# Patient Record
Sex: Female | Born: 1975 | Race: Black or African American | Hispanic: No | Marital: Single | Smoking: Never smoker
Health system: Southern US, Community
[De-identification: ages and names within clinical notes are randomized; demographics above are authoritative.]

## PROBLEM LIST (undated history)

## (undated) DIAGNOSIS — I10 Essential (primary) hypertension: Secondary | ICD-10-CM

## (undated) HISTORY — PX: BACK SURGERY: SHX140

---

## 2004-02-25 ENCOUNTER — Emergency Department: Payer: Self-pay | Admitting: Emergency Medicine

## 2005-02-19 ENCOUNTER — Observation Stay: Payer: Self-pay | Admitting: Obstetrics and Gynecology

## 2005-09-21 ENCOUNTER — Emergency Department: Payer: Self-pay | Admitting: Emergency Medicine

## 2014-05-19 ENCOUNTER — Emergency Department: Payer: Self-pay | Admitting: Emergency Medicine

## 2014-05-19 LAB — SALICYLATE LEVEL

## 2014-05-19 LAB — COMPREHENSIVE METABOLIC PANEL
ALT: 17 U/L
AST: 28 U/L
Albumin: 3.7 g/dL
Alkaline Phosphatase: 79 U/L
Anion Gap: 7 (ref 7–16)
BUN: 7 mg/dL
Bilirubin,Total: 0.4 mg/dL
CHLORIDE: 102 mmol/L
CO2: 25 mmol/L
Calcium, Total: 8 mg/dL — ABNORMAL LOW
Creatinine: 0.66 mg/dL
EGFR (African American): >60
Glucose: 109 mg/dL — ABNORMAL HIGH
Potassium: 2.8 mmol/L — ABNORMAL LOW
Sodium: 134 mmol/L — ABNORMAL LOW
Total Protein: 8.6 g/dL — ABNORMAL HIGH

## 2014-05-19 LAB — CBC
HCT: 33.9 % — AB (ref 35.0–47.0)
HGB: 10.8 g/dL — ABNORMAL LOW (ref 12.0–16.0)
MCH: 24.7 pg — ABNORMAL LOW (ref 26.0–34.0)
MCHC: 31.7 g/dL — ABNORMAL LOW (ref 32.0–36.0)
MCV: 78 fL — ABNORMAL LOW (ref 80–100)
PLATELETS: 344 10*3/uL (ref 150–440)
RBC: 4.35 10*6/uL (ref 3.80–5.20)
RDW: 15.3 % — AB (ref 11.5–14.5)
WBC: 7 10*3/uL (ref 3.6–11.0)

## 2014-05-19 LAB — URINALYSIS, COMPLETE
Bacteria: NONE SEEN
Glucose,UR: NEGATIVE mg/dL (ref 0–75)
Nitrite: NEGATIVE
Ph: 5 (ref 4.5–8.0)
RBC,UR: 3582 /HPF (ref 0–5)
SPECIFIC GRAVITY: 1.033 (ref 1.003–1.030)
Squamous Epithelial: 3
WBC UR: 15 /HPF (ref 0–5)

## 2014-05-19 LAB — ETHANOL

## 2014-05-19 LAB — DRUG SCREEN, URINE
Amphetamines, Ur Screen: NEGATIVE
Barbiturates, Ur Screen: NEGATIVE
Benzodiazepine, Ur Scrn: NEGATIVE
Cannabinoid 50 Ng, Ur ~~LOC~~: NEGATIVE
Cocaine Metabolite,Ur ~~LOC~~: NEGATIVE
MDMA (ECSTASY) UR SCREEN: NEGATIVE
METHADONE, UR SCREEN: NEGATIVE
Opiate, Ur Screen: NEGATIVE
Phencyclidine (PCP) Ur S: NEGATIVE
TRICYCLIC, UR SCREEN: NEGATIVE

## 2014-05-19 LAB — ACETAMINOPHEN LEVEL: Acetaminophen: <10 ug/mL

## 2014-05-19 LAB — PREGNANCY, URINE: Pregnancy Test, Urine: NEGATIVE m[IU]/mL

## 2014-06-24 NOTE — Consult Note (Signed)
PATIENT NAME:  Vanessa Banks, Vanessa Banks MR#:  [id number] DATE OF BIRTH:  1975/07/23  DATE OF CONSULTATION:  05/20/2014  REFERRING PHYSICIAN:   CONSULTING PHYSICIAN:  Kemiyah Tarazon K. Guss Bundehalla, MD  PL[facility] DICTATION[facility], [place].  AGE: 39 years.  SEX: Female.  RACE: African American.  SUBJECTIVE: The patient was seen in consultation at [facility]. The patient is a 39 year old African American female, not employed, and last worked a long time ago, and is married to her husband who is 39 years old and works at [employer]. The patient stays home with her 2 kids who are 43248-year-old and 748-year-old. Last night, the patient got into a conflict with her husband and she told him that she had thoughts to hurt herself. The patient drove [facility] because she was feeling increasingly depressed, stressed out, and having suicidal wishes.  PAST PSYCHIATRIC HISTORY: No previous history of inpatient on psychiatry.  No history of suicidal attempt. Not being followed by any psychiatrist.  ALCOHOL AND DRUGS: Denies drinking alcohol. Denies street or prescription drug abuse.  Denies smoking nicotine cigarettes.   MENTAL STATUS EXAMINATION: The patient is alert and oriented, calm, pleasant, and cooperative. No agitation. Affect neutral. Mood stable. Admits that she was feeling low and down because of marital conflicts and financial issues. No psychosis. Does not appear to be responding to internal stimuli. Cognition intact. General fund of knowledge and information fair. Denies suicidal or homicidal plans and contracts for safety. Insight and judgment fair. Impulse control is fair.   IMPRESSION: Major depression, single episode with suicidal idea, which has resolved and contracts for safety.  RECOMMENDATIONS: Discontinue involuntary commitment. Discharge the patient home and the patient will get an appointment in a local mental health clinic tomorrow, 05/21/2014, for  evaluation for depression and marital conflicts and follow up with the same.   ____________________________ Jannet MantisSurya K. Guss Bundehalla, MD skc:ST D: 05/20/2014 18:15:18 ET T: 05/20/2014 22:37:18 ET JOB#: [id number]  cc: Monika SalkSurya K. Guss Bundehalla, MD, <Dictator> Beau FannySURYA K Janan Bogie MD ELECTRONICALLY SIGNED 05/26/2014 18:20

## 2014-12-26 ENCOUNTER — Encounter: Payer: Self-pay | Admitting: Emergency Medicine

## 2014-12-26 ENCOUNTER — Emergency Department
Admission: EM | Admit: 2014-12-26 | Discharge: 2014-12-26 | Disposition: A | Discharge status: Home / Self Care | Payer: Medicaid Other | Attending: Emergency Medicine | Admitting: Emergency Medicine

## 2014-12-26 ENCOUNTER — Emergency Department: Payer: Medicaid Other

## 2014-12-26 DIAGNOSIS — Z79899 Other long term (current) drug therapy: Secondary | ICD-10-CM | POA: Insufficient documentation

## 2014-12-26 DIAGNOSIS — R102 Pelvic and perineal pain: Secondary | ICD-10-CM

## 2014-12-26 DIAGNOSIS — R109 Unspecified abdominal pain: Secondary | ICD-10-CM

## 2014-12-26 DIAGNOSIS — N939 Abnormal uterine and vaginal bleeding, unspecified: Secondary | ICD-10-CM | POA: Diagnosis not present

## 2014-12-26 DIAGNOSIS — Z3202 Encounter for pregnancy test, result negative: Secondary | ICD-10-CM | POA: Diagnosis not present

## 2014-12-26 LAB — URINALYSIS COMPLETE WITH MICROSCOPIC (ARMC ONLY)
Bilirubin Urine: NEGATIVE
Glucose, UA: NEGATIVE mg/dL
KETONES UR: NEGATIVE mg/dL
LEUKOCYTES UA: NEGATIVE
Nitrite: NEGATIVE
PH: 7 (ref 5.0–8.0)
PROTEIN: 30 mg/dL — AB
SPECIFIC GRAVITY, URINE: 1.03 (ref 1.005–1.030)

## 2014-12-26 LAB — WET PREP, GENITAL: Trich, Wet Prep: NEGATIVE — AB

## 2014-12-26 LAB — HCG, QUANTITATIVE, PREGNANCY: hCG, Beta Chain, Quant, S: <1 m[IU]/mL (ref <5)

## 2014-12-26 LAB — CHLAMYDIA/NGC RT PCR (ARMC ONLY)
Chlamydia Tr: NOT DETECTED
N GONORRHOEAE: NOT DETECTED

## 2014-12-26 LAB — CBC
HCT: 31.1 % — ABNORMAL LOW (ref 35.0–47.0)
Hemoglobin: 9.9 g/dL — ABNORMAL LOW (ref 12.0–16.0)
MCH: 24.9 pg — AB (ref 26.0–34.0)
MCHC: 31.7 g/dL — ABNORMAL LOW (ref 32.0–36.0)
MCV: 78.5 fL — AB (ref 80.0–100.0)
PLATELETS: 352 10*3/uL (ref 150–440)
RBC: 3.97 MIL/uL (ref 3.80–5.20)
RDW: 15.4 % — AB (ref 11.5–14.5)
WBC: 5.9 10*3/uL (ref 3.6–11.0)

## 2014-12-26 MED ORDER — IBUPROFEN 800 MG PO TABS
800.0000 mg | ORAL_TABLET | Freq: Once | ORAL | Status: AC
  Administered 2014-12-26: 800 mg via ORAL
  Filled 2014-12-26: qty 1

## 2014-12-26 MED ORDER — TRAMADOL HCL 50 MG PO TABS
50.0000 mg | ORAL_TABLET | Freq: Once | ORAL | Status: AC
  Administered 2014-12-26: 50 mg via ORAL

## 2014-12-26 MED ORDER — TRAMADOL HCL 50 MG PO TABS: 50.0000 mg | ORAL_TABLET | Freq: Four times a day (QID) | ORAL | Status: AC | PRN

## 2014-12-26 MED ORDER — OXYCODONE-ACETAMINOPHEN 5-325 MG PO TABS
1.0000 | ORAL_TABLET | Freq: Once | ORAL | Status: AC
  Administered 2014-12-26: 1 via ORAL
  Filled 2014-12-26: qty 1

## 2014-12-26 MED ORDER — TRAMADOL HCL 50 MG PO TABS
ORAL_TABLET | ORAL | Status: AC
  Administered 2014-12-26: 50 mg via ORAL
  Filled 2014-12-26: qty 1

## 2014-12-26 NOTE — ED Notes (Signed)
Pt to triage via w/c, tearful; reports menstrual period started yesterday and is usually heavy; tonight began heavier bleeding with severe vaginal pain

## 2014-12-26 NOTE — ED Notes (Signed)
Patient transported to Ultrasound 

## 2014-12-26 NOTE — ED Notes (Signed)
Pt was provided discharge instructions with verbalization of understanding. Pt was given the opportunity to ask questions and questions were answered. Pt was provided pads upon discharge home for heavy bleeding. Pt was wheeled out to the car in a wheel chair per pt request.

## 2014-12-26 NOTE — ED Notes (Signed)
Pt ambulatory to toilet at this time time no concerns.

## 2014-12-26 NOTE — Discharge Instructions (Signed)
Dysfunctional Uterine Bleeding °Dysfunctional uterine bleeding is abnormal bleeding from the uterus. Dysfunctional uterine bleeding includes: °· A period that comes earlier or later than usual. °· A period that is lighter, heavier, or has blood clots. °· Bleeding between periods. °· Skipping one or more periods. °· Bleeding after sexual intercourse. °· Bleeding after menopause. °HOME CARE INSTRUCTIONS  °Pay attention to any changes in your symptoms. Follow these instructions to help with your condition: °Eating °· Eat well-balanced meals. Include foods that are high in iron, such as liver, meat, shellfish, green leafy vegetables, and eggs. °· If you become constipated: °¨ Drink plenty of water. °¨ Eat fruits and vegetables that are high in water and fiber, such as spinach, carrots, raspberries, apples, and mango. °Medicines °· Take over-the-counter and prescription medicines only as told by your health care provider. °· Do not change medicines without talking with your health care provider. °· Aspirin or medicines that contain aspirin may make the bleeding worse. Do not take those medicines: °¨ During the week before your period. °¨ During your period. °· If you were prescribed iron pills, take them as told by your health care provider. Iron pills help to replace iron that your body loses because of this condition. °Activity °· If you need to change your sanitary pad or tampon more than one time every 2 hours: °¨ Lie in bed with your feet raised (elevated). °¨ Place a cold pack on your lower abdomen. °¨ Rest as much as possible until the bleeding stops or slows down. °· Do not try to lose weight until the bleeding has stopped and your blood iron level is back to normal. °Other Instructions °· For two months, write down: °¨ When your period starts. °¨ When your period ends. °¨ When any abnormal bleeding occurs. °¨ What problems you notice. °· Keep all follow up visits as told by your health care provider. This is  important. °SEEK MEDICAL CARE IF: °· You get light-headed or weak. °· You have nausea and vomiting. °· You cannot eat or drink without vomiting. °· You feel dizzy or have diarrhea while you are taking medicines. °· You are taking birth control pills or hormones, and you want to change them or stop taking them. °SEEK IMMEDIATE MEDICAL CARE IF: °· You develop a fever or chills. °· You need to change your sanitary pad or tampon more than one time per hour. °· Your bleeding becomes heavier, or your flow contains clots more often. °· You develop pain in your abdomen. °· You lose consciousness. °· You develop a rash. °  °This information is not intended to replace advice given to you by your health care provider. Make sure you discuss any questions you have with your health care provider. °  °Document Released: 02/07/2000 Document Revised: 10/31/2014 Document Reviewed: 05/07/2014 °Elsevier Interactive Patient Education ©2016 Elsevier Inc. ° °Pelvic Pain, Female °Female pelvic pain can be caused by many different things and start from a variety of places. Pelvic pain refers to pain that is located in the lower half of the abdomen and between your hips. The pain may occur over a short period of time (acute) or may be reoccurring (chronic). The cause of pelvic pain may be related to disorders affecting the female reproductive organs (gynecologic), but it may also be related to the bladder, kidney stones, an intestinal complication, or muscle or skeletal problems. Getting help right away for pelvic pain is important, especially if there has been severe, sharp, or a sudden   onset of unusual pain. It is also important to get help right away because some types of pelvic pain can be life threatening.  CAUSES  Below are only some of the causes of pelvic pain. The causes of pelvic pain can be in one of several categories.   Gynecologic.  Pelvic inflammatory disease.  Sexually transmitted infection.  Ovarian cyst or a  twisted ovarian ligament (ovarian torsion).  Uterine lining that grows outside the uterus (endometriosis).  Fibroids, cysts, or tumors.  Ovulation.  Pregnancy.  Pregnancy that occurs outside the uterus (ectopic pregnancy).  Miscarriage.  Labor.  Abruption of the placenta or ruptured uterus.  Infection.  Uterine infection (endometritis).  Bladder infection.  Diverticulitis.  Miscarriage related to a uterine infection (septic abortion).  Bladder.  Inflammation of the bladder (cystitis).  Kidney stone(s).  Gastrointestinal.  Constipation.  Diverticulitis.  Neurologic.  Trauma.  Feeling pelvic pain because of mental or emotional causes (psychosomatic).  Cancers of the bowel or pelvis. EVALUATION  Your caregiver will want to take a careful history of your concerns. This includes recent changes in your health, a careful gynecologic history of your periods (menses), and a sexual history. Obtaining your family history and medical history is also important. Your caregiver may suggest a pelvic exam. A pelvic exam will help identify the location and severity of the pain. It also helps in the evaluation of which organ system may be involved. In order to identify the cause of the pelvic pain and be properly treated, your caregiver may order tests. These tests may include:   A pregnancy test.  Pelvic ultrasonography.  An X-ray exam of the abdomen.  A urinalysis or evaluation of vaginal discharge.  Blood tests. HOME CARE INSTRUCTIONS   Only take over-the-counter or prescription medicines for pain, discomfort, or fever as directed by your caregiver.   Rest as directed by your caregiver.   Eat a balanced diet.   Drink enough fluids to make your urine clear or pale yellow, or as directed.   Avoid sexual intercourse if it causes pain.   Apply warm or cold compresses to the lower abdomen depending on which one helps the pain.   Avoid stressful situations.    Keep a journal of your pelvic pain. Write down when it started, where the pain is located, and if there are things that seem to be associated with the pain, such as food or your menstrual cycle.  Follow up with your caregiver as directed.  SEEK MEDICAL CARE IF:  Your medicine does not help your pain.  You have abnormal vaginal discharge. SEEK IMMEDIATE MEDICAL CARE IF:   You have heavy bleeding from the vagina.   Your pelvic pain increases.   You feel light-headed or faint.   You have chills.   You have pain with urination or blood in your urine.   You have uncontrolled diarrhea or vomiting.   You have a fever or persistent symptoms for more than 3 days.  You have a fever and your symptoms suddenly get worse.   You are being physically or sexually abused.   This information is not intended to replace advice given to you by your health care provider. Make sure you discuss any questions you have with your health care provider.   Document Released: 01/07/2004 Document Revised: 10/31/2014 Document Reviewed: 06/01/2011 Elsevier Interactive Patient Education [employer]

## 2014-12-26 NOTE — ED Notes (Signed)
Pt returned from US

## 2014-12-26 NOTE — ED Provider Notes (Signed)
[facility] Emergency Department Provider Note  ____________________________________________  Time seen: Approximately 104 AM  I have reviewed the triage vital signs and the nursing notes.   HISTORY  Chief Complaint Vaginal Bleeding    HPI Luz LexGeorgette Matzek is a 39 y.o. female who comes into the hospital today with vaginal pain and vaginal bleeding. The patient reports that she has a history of heavy periods but tonight she had sharp pain from her stomach to her vagina. The patient reports that she started her period yesterday and has been taking Motrin and Pamprin but the bleeding was a little heavier than normal and this evening she started having this vaginal pain. The patient reports that the pain is sharp and 10 out of 10 in intensity. The patient denies any daily medication and reports that her periods typically last about 6 days. The patient reports that she took her typical medications but they did not help with the symptoms today. The patient reports she has not had pain like this in the past so she decided to come in for evaluation. Nothing makes the pain better or worse. The patient has not had any nausea, no back pain, no pain with urination, no shortness of breath or chest pain.   History reviewed. No pertinent past medical history.  There are no active problems to display for this patient.   Past Surgical History  Procedure Laterality Date  . Back surgery      Current Outpatient Rx  Name  Route  Sig  Dispense  Refill  . sertraline (ZOLOFT) 50 MG tablet   Oral   Take 50 mg by mouth daily.         . traMADol (ULTRAM) 50 MG tablet   Oral   Take 1 tablet (50 mg total) by mouth every 6 (six) hours as needed.   12 tablet   0     Allergies Review of patient's allergies indicates no known allergies.  No family history on file.  Social History Social History  Substance Use Topics  . Smoking status: Never Smoker   . Smokeless tobacco:  None  . Alcohol Use: No    Review of Systems Constitutional: No fever/chills Eyes: No visual changes. ENT: No sore throat. Cardiovascular: Denies chest pain. Respiratory: Denies shortness of breath. Gastrointestinal: abdominal pain.  No nausea, no vomiting.  No diarrhea.  No constipation. Genitourinary: Vaginal bleeding and vaginal pain. Musculoskeletal: Negative for back pain. Skin: Negative for rash. Neurological: Negative for headaches, focal weakness or numbness.  10-point ROS otherwise negative.  ____________________________________________   PHYSICAL EXAM:  VITAL SIGNS: ED Triage Vitals  Enc Vitals Group     BP 12/26/14 0016 182/151 mmHg     Pulse Rate 12/26/14 0016 83     Resp 12/26/14 0016 28     Temp 12/26/14 0016 98.6 F (37 C)     Temp Source 12/26/14 0016 Oral     SpO2 12/26/14 0016 99 %     Weight 12/26/14 0016 210 lb (95.255 kg)     Height 12/26/14 0016 5\' 5"  (1.651 m)     Head Cir --      Peak Flow --      Pain Score 12/26/14 0014 10     Pain Loc --      Pain Edu? --      Excl. in GC? --     Constitutional: Alert and oriented. Well appearing and in a rate distress. Eyes: Conjunctivae are normal. PERRL. EOMI.  Head: Atraumatic. Nose: No congestion/rhinnorhea. Mouth/Throat: Mucous membranes are moist.  Oropharynx non-erythematous. Cardiovascular: Normal rate, regular rhythm. Grossly normal heart sounds.  Good peripheral circulation. Respiratory: Normal respiratory effort.  No retractions. Lungs CTAB. Gastrointestinal: Soft and nontender. No distention. Positive bowel sounds Genitourinary: Normal external genitalia. Minimal blood in the vaginal vault. Some tenderness and firmness to the uterus, no foul-smelling odor, no cervical motion tenderness. Musculoskeletal: No lower extremity tenderness nor edema.   Neurologic:  Normal speech and language. No gross focal neurologic deficits are appreciated.  Skin:  Skin is warm, dry and intact.  Psychiatric:  Mood and affect are normal.   ____________________________________________   LABS (all labs ordered are listed, but only abnormal results are displayed)  Labs Reviewed  WET PREP, GENITAL - Abnormal; Notable for the following:    Yeast Wet Prep HPF POC NONE (*)    Trich, Wet Prep NEGATIVE (*)    Clue Cells Wet Prep HPF POC NONE (*)    WBC, Wet Prep HPF POC FEW (*)    All other components within normal limits  CBC - Abnormal; Notable for the following:    Hemoglobin 9.9 (*)    HCT 31.1 (*)    MCV 78.5 (*)    MCH 24.9 (*)    MCHC 31.7 (*)    RDW 15.4 (*)    All other components within normal limits  URINALYSIS COMPLETEWITH MICROSCOPIC ([facility] ONLY) - Abnormal; Notable for the following:    Color, Urine YELLOW (*)    APPearance HAZY (*)    Hgb urine dipstick 3+ (*)    Protein, ur 30 (*)    Bacteria, UA RARE (*)    Squamous Epithelial / LPF 6-30 (*)    All other components within normal limits  CHLAMYDIA/NGC RT PCR ([facility] ONLY)  HCG, QUANTITATIVE, PREGNANCY   ____________________________________________  EKG  None ____________________________________________  RADIOLOGY  Pelvic ultrasound: Second endometrial stripe filled with fluid and debris, no color flow demonstrated, this is likely physiologic. Prominence of the cervix is indeterminate and mass lesion not excluded. Suggest follow-up imaging after completion of menses for further evaluation. ____________________________________________   PROCEDURES  Procedure(s) performed: None  Critical Care performed: No  ____________________________________________   INITIAL IMPRESSION / ASSESSMENT AND PLAN / ED COURSE  Pertinent labs & imaging results that were available during my care of the patient were reviewed by me and considered in my medical decision making (see chart for details).  This is a 39 year old female who comes in today with vaginal pain as well as vaginal bleeding. I gave the patient initially  some ibuprofen and then some Percocet and then some tramadol. Ultrasound shows some thickening of the cervix as well as a thickened and initial stripe. On evaluation I obtained a partial view of the cervix but could not fully evaluated. The patient does not have heavy bleeding at this time and with the medications her pain is mildly improved. I informed the patient that she does need to follow up with OB so they can further evaluate her and determine if she needs biopsies of the thickened portion of her cervix. Otherwise the patient be discharged home to follow-up with Dr. Greggory Keen. I explained the results of the patient and she understands and agrees with the plan as stated. ____________________________________________   FINAL CLINICAL IMPRESSION(S) / ED DIAGNOSES  Final diagnoses:  Abdominal pain  Vaginal pain  Vaginal bleeding      Rebecka Apley, MD 12/26/14 [phone]

## 2015-02-15 ENCOUNTER — Emergency Department
Admission: EM | Admit: 2015-02-15 | Discharge: 2015-02-15 | Disposition: A | Discharge status: Home / Self Care | Payer: Medicaid Other | Attending: Emergency Medicine | Admitting: Emergency Medicine

## 2015-02-15 ENCOUNTER — Encounter: Payer: Self-pay | Admitting: Emergency Medicine

## 2015-02-15 DIAGNOSIS — S79911A Unspecified injury of right hip, initial encounter: Secondary | ICD-10-CM | POA: Diagnosis not present

## 2015-02-15 DIAGNOSIS — Y92410 Unspecified street and highway as the place of occurrence of the external cause: Secondary | ICD-10-CM | POA: Diagnosis not present

## 2015-02-15 DIAGNOSIS — Y9389 Activity, other specified: Secondary | ICD-10-CM | POA: Diagnosis not present

## 2015-02-15 DIAGNOSIS — Y998 Other external cause status: Secondary | ICD-10-CM | POA: Insufficient documentation

## 2015-02-15 DIAGNOSIS — S39012A Strain of muscle, fascia and tendon of lower back, initial encounter: Secondary | ICD-10-CM | POA: Diagnosis not present

## 2015-02-15 DIAGNOSIS — S3992XA Unspecified injury of lower back, initial encounter: Secondary | ICD-10-CM | POA: Diagnosis present

## 2015-02-15 DIAGNOSIS — Z79899 Other long term (current) drug therapy: Secondary | ICD-10-CM | POA: Insufficient documentation

## 2015-02-15 MED ORDER — NAPROXEN 500 MG PO TBEC: 500.0000 mg | DELAYED_RELEASE_TABLET | Freq: Two times a day (BID) | ORAL | Status: AC

## 2015-02-15 MED ORDER — CYCLOBENZAPRINE HCL 5 MG PO TABS
5.0000 mg | ORAL_TABLET | Freq: Three times a day (TID) | ORAL | Status: AC | PRN
Start: 2015-02-15 — End: ?
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
## 2015-02-15 NOTE — ED Notes (Signed)
AAOx3.  Skin warm and dry.  NAD. Ambulates with easy and steady gait.  Moving all extremities equally and strong. 

## 2015-02-15 NOTE — Discharge Instructions (Signed)
Lumbosacral Strain °Lumbosacral strain is a strain of any of the parts that make up your lumbosacral vertebrae. Your lumbosacral vertebrae are the bones that make up the lower third of your backbone. Your lumbosacral vertebrae are held together by muscles and tough, fibrous tissue (ligaments).  °CAUSES  °A sudden blow to your back can cause lumbosacral strain. Also, anything that causes an excessive stretch of the muscles in the low back can cause this strain. This is typically seen when people exert themselves strenuously, fall, lift heavy objects, bend, or crouch repeatedly. °RISK FACTORS °· Physically demanding work. °· Participation in pushing or pulling sports or sports that require a sudden twist of the back (tennis, golf, baseball). °· Weight lifting. °· Excessive lower back curvature. °· Forward-tilted pelvis. °· Weak back or abdominal muscles or both. °· Tight hamstrings. °SIGNS AND SYMPTOMS  °Lumbosacral strain may cause pain in the area of your injury or pain that moves (radiates) down your leg.  °DIAGNOSIS °Your health care provider can often diagnose lumbosacral strain through a physical exam. In some cases, you may need tests such as X-ray exams.  °TREATMENT  °Treatment for your lower back injury depends on many factors that your clinician will have to evaluate. However, most treatment will include the use of anti-inflammatory medicines. °HOME CARE INSTRUCTIONS  °· Avoid hard physical activities (tennis, racquetball, waterskiing) if you are not in proper physical condition for it. This may aggravate or create problems. °· If you have a back problem, avoid sports requiring sudden body movements. Swimming and walking are generally safer activities. °· Maintain good posture. °· Maintain a healthy weight. °· For acute conditions, you may put ice on the injured area. °· Put ice in a plastic bag. °· Place a towel between your skin and the bag. °· Leave the ice on for 20 minutes, 2-3 times a day. °· When the  low back starts healing, stretching and strengthening exercises may be recommended. °SEEK MEDICAL CARE IF: °· Your back pain is getting worse. °· You experience severe back pain not relieved with medicines. °SEEK IMMEDIATE MEDICAL CARE IF:  °· You have numbness, tingling, weakness, or problems with the use of your arms or legs. °· There is a change in bowel or bladder control. °· You have increasing pain in any area of the body, including your belly (abdomen). °· You notice shortness of breath, dizziness, or feel faint. °· You feel sick to your stomach (nauseous), are throwing up (vomiting), or become sweaty. °· You notice discoloration of your toes or legs, or your feet get very cold. °MAKE SURE YOU:  °· Understand these instructions. °· Will watch your condition. °· Will get help right away if you are not doing well or get worse. °  °This information is not intended to replace advice given to you by your health care provider. Make sure you discuss any questions you have with your health care provider. °  °Document Released: 11/19/2004 Document Revised: 03/02/2014 Document Reviewed: 09/28/2012 °Elsevier Interactive Patient Education ©2016 Elsevier Inc. ° °Motor Vehicle Collision °It is common to have multiple bruises and sore muscles after a motor vehicle collision (MVC). These tend to feel worse for the first 24 hours. You may have the most stiffness and soreness over the first several hours. You may also feel worse when you wake up the first morning after your collision. After this point, you will usually begin to improve with each day. The speed of improvement often depends on the severity of the   collision, the number of injuries, and the location and nature of these injuries. HOME CARE INSTRUCTIONS  Put ice on the injured area.  Put ice in a plastic bag.  Place a towel between your skin and the bag.  Leave the ice on for 15-20 minutes, 3-4 times a day, or as directed by your health care  provider.  Drink enough fluids to keep your urine clear or pale yellow. Do not drink alcohol.  Take a warm shower or bath once or twice a day. This will increase blood flow to sore muscles.  You may return to activities as directed by your caregiver. Be careful when lifting, as this may aggravate neck or back pain.  Only take over-the-counter or prescription medicines for pain, discomfort, or fever as directed by your caregiver. Do not use aspirin. This may increase bruising and bleeding. SEEK IMMEDIATE MEDICAL CARE IF:  You have numbness, tingling, or weakness in the arms or legs.  You develop severe headaches not relieved with medicine.  You have severe neck pain, especially tenderness in the middle of the back of your neck.  You have changes in bowel or bladder control.  There is increasing pain in any area of the body.  You have shortness of breath, light-headedness, dizziness, or fainting.  You have chest pain.  You feel sick to your stomach (nauseous), throw up (vomit), or sweat.  You have increasing abdominal discomfort.  There is blood in your urine, stool, or vomit.  You have pain in your shoulder (shoulder strap areas).  You feel your symptoms are getting worse. MAKE SURE YOU:  Understand these instructions.  Will watch your condition.  Will get help right away if you are not doing well or get worse.   This information is not intended to replace advice given to you by your health care provider. Make sure you discuss any questions you have with your health care provider.   Document Released: 02/09/2005 Document Revised: 03/02/2014 Document Reviewed: 07/09/2010 Elsevier Interactive Patient Education [employer]  Your exam is essentially normal. Take the prescription medicine as needed for pain & muscle spasms.  Apply ice to reduce pain.
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
## 2016-06-30 ENCOUNTER — Emergency Department (HOSPITAL_COMMUNITY)
Admission: EM | Admit: 2016-06-30 | Discharge: 2016-06-30 | Disposition: A | Discharge status: Home / Self Care | Payer: Medicaid Other | Attending: Emergency Medicine | Admitting: Emergency Medicine

## 2016-06-30 ENCOUNTER — Emergency Department (HOSPITAL_COMMUNITY): Payer: Medicaid Other

## 2016-06-30 ENCOUNTER — Encounter: Payer: Self-pay | Admitting: Emergency Medicine

## 2016-06-30 DIAGNOSIS — R102 Pelvic and perineal pain: Secondary | ICD-10-CM

## 2016-06-30 DIAGNOSIS — N3001 Acute cystitis with hematuria: Secondary | ICD-10-CM

## 2016-06-30 DIAGNOSIS — Z79899 Other long term (current) drug therapy: Secondary | ICD-10-CM | POA: Insufficient documentation

## 2016-06-30 DIAGNOSIS — R319 Hematuria, unspecified: Secondary | ICD-10-CM | POA: Diagnosis present

## 2016-06-30 LAB — URINALYSIS, ROUTINE W REFLEX MICROSCOPIC
Bilirubin Urine: NEGATIVE
Glucose, UA: NEGATIVE mg/dL
KETONES UR: NEGATIVE mg/dL
Nitrite: NEGATIVE
Protein, ur: 30 mg/dL — AB
Specific Gravity, Urine: 1.005 (ref 1.005–1.030)
pH: 7 (ref 5.0–8.0)

## 2016-06-30 LAB — WET PREP, GENITAL
CLUE CELLS WET PREP: NONE SEEN
Sperm: NONE SEEN
TRICH WET PREP: NONE SEEN
Yeast Wet Prep HPF POC: NONE SEEN

## 2016-06-30 LAB — CBC
HEMATOCRIT: 29.6 % — AB (ref 36.0–46.0)
Hemoglobin: 9 g/dL — ABNORMAL LOW (ref 12.0–15.0)
MCH: 23.1 pg — ABNORMAL LOW (ref 26.0–34.0)
MCHC: 30.4 g/dL (ref 30.0–36.0)
MCV: 76.1 fL — ABNORMAL LOW (ref 78.0–100.0)
PLATELETS: 542 10*3/uL — AB (ref 150–400)
RBC: 3.89 MIL/uL (ref 3.87–5.11)
RDW: 22.3 % — AB (ref 11.5–15.5)
WBC: 9.2 10*3/uL (ref 4.0–10.5)

## 2016-06-30 LAB — HEPATIC FUNCTION PANEL
ALBUMIN: 2.5 g/dL — AB (ref 3.5–5.0)
ALT: 11 U/L — ABNORMAL LOW (ref 14–54)
AST: 23 U/L (ref 15–41)
Alkaline Phosphatase: 61 U/L (ref 38–126)
Bilirubin, Direct: <0.1 mg/dL — ABNORMAL LOW (ref 0.1–0.5)
TOTAL PROTEIN: 8.4 g/dL — AB (ref 6.5–8.1)
Total Bilirubin: 0.4 mg/dL (ref 0.3–1.2)

## 2016-06-30 LAB — BASIC METABOLIC PANEL
ANION GAP: 9 (ref 5–15)
BUN: <5 mg/dL — ABNORMAL LOW (ref 6–20)
CALCIUM: 8.4 mg/dL — AB (ref 8.9–10.3)
CO2: 26 mmol/L (ref 22–32)
Chloride: 103 mmol/L (ref 101–111)
Creatinine, Ser: 0.94 mg/dL (ref 0.44–1.00)
Glucose, Bld: 91 mg/dL (ref 65–99)
Potassium: 3.1 mmol/L — ABNORMAL LOW (ref 3.5–5.1)
SODIUM: 138 mmol/L (ref 135–145)

## 2016-06-30 LAB — LIPASE, BLOOD: LIPASE: 20 U/L (ref 11–51)

## 2016-06-30 LAB — I-STAT BETA HCG BLOOD, ED (MC, WL, AP ONLY): I-stat hCG, quantitative: <5 m[IU]/mL (ref <5)

## 2016-06-30 MED ORDER — POTASSIUM CHLORIDE CRYS ER 20 MEQ PO TBCR
40.0000 meq | EXTENDED_RELEASE_TABLET | Freq: Once | ORAL | Status: DC
  Filled 2016-06-30: qty 2

## 2016-06-30 MED ORDER — MORPHINE SULFATE (PF) 4 MG/ML IV SOLN
4.0000 mg | Freq: Once | INTRAVENOUS | Status: AC
  Administered 2016-06-30: 4 mg via INTRAVENOUS
  Filled 2016-06-30: qty 1

## 2016-06-30 MED ORDER — CEPHALEXIN 500 MG PO CAPS: 500.0000 mg | ORAL_CAPSULE | Freq: Two times a day (BID) | ORAL | 0 refills | Status: AC

## 2016-06-30 MED ORDER — ONDANSETRON 4 MG PO TBDP
4.0000 mg | ORAL_TABLET | Freq: Once | ORAL | Status: AC
  Administered 2016-06-30: 4 mg via ORAL
  Filled 2016-06-30: qty 1

## 2016-06-30 MED ORDER — SODIUM CHLORIDE 0.9 % IV BOLUS (SEPSIS)
1000.0000 mL | Freq: Once | INTRAVENOUS | Status: AC
  Administered 2016-06-30: 1000 mL via INTRAVENOUS

## 2016-06-30 NOTE — ED Notes (Addendum)
Assisted pt to the BR-pt ambulated with steady gait without difficulty.  Denies feeling dizzy after getting OOB.  Pt is A&O x 4.  IVF bolus infusing

## 2016-06-30 NOTE — ED Notes (Signed)
Patient transported to Ultrasound 

## 2016-06-30 NOTE — ED Triage Notes (Signed)
Was on antibiotics for a UTI- since April 6th- seen by primary md  On April 17th was put on iron -- for low hgb, had a pelvic US on 18th -- had endometriosis bx and IUD placed-- had transfusion on April 25th, was in patient [facility]5/2--  Pt c/o still feels bad, weak, hematuria-- -- supposed to be taking antibiotics -- but antibiotics are making pt throw up

## 2016-06-30 NOTE — ED Notes (Addendum)
Contacted pt and left her a message, to notify her that she did not get her potassium tabs prior to dc, therefore she needs to increase intake of food rich in potassium and have her level checked as instructed. Joni Reiningicole EDPA is aware

## 2016-06-30 NOTE — Discharge Instructions (Signed)
Take your antibiotics as prescribed until completed. Continue taking your prescriptions of doxycycline and Flagyl as prescribed until completed. You may take your prescription of Zofran as needed for nausea. Continue drinking fluids at home to remain hydrated.  Follow-up with your family doctor within the next 3-4 days for follow-up evaluation and repeat evaluation of your blood work regarding her hemoglobin and potassium. I recommend fine up with her gynecologist at your scheduled appointment. Please return to the Emergency Department if symptoms worsen or new onset of fever, chest pain, difficulty breathing, new/worsening abdominal pain, vomiting, unable to keep fluids down, vaginal bleeding, flank pain, difficulty urinating.

## 2016-06-30 NOTE — ED Notes (Signed)
This RN went into room and attempted to go d/c patient. Pt no longer in room. Pt appears to have left at this time.

## 2016-06-30 NOTE — ED Provider Notes (Signed)
[facility]-EMERGENCY DEPT Provider Note   CSN: [id number] Arrival date & time: 06/30/16  4098     History   Chief Complaint Chief Complaint  Patient presents with  . Hematuria  . Abdominal Pain    HPI Vanessa Banks is a 41 y.o. female.  HPI   Patient is a 41 year old female with PMH of IDA who presents to the ED with complaint of worsening abdominal pain. Patient notes she was initially seen by her family doctor the beginning of April and was diagnosed with a UTI. She was then seen on April 17 by her PCP and placed on iron for anemia. Patient reports she had a pelvic ultrasound performed on April 18 which showed evidence of endometriosis and notes she had an IUD placed. Patient was then sent to the ED on April 25 due to worsening symptoms and anemia and notes she had a blood transfusion performed. She then notes she return to the ED at [facility] on 4/30 and was admitted for tubo-ovarian abscess. She states she was discharged home on 5/2 and has since followed up with her PCP and gynecologist last week. Patient notes over the weekend she continued having intermittent sharp pain to her right lower quadrant which she states feels worse. Endorses associated hematuria, nausea and vomiting. Denies fever, chills, CP, SOB, cough, hematemesis, diarrhea, constipation, dysuria, vaginal bleeding or discharge. Patient reports she was discharged home from her last admission with doxycycline and Flagyl for her infection but notes she was unable to take the medication for the past 2 days due to it causing worsening vomiting. Endorses abdominal surgical history of C-section and cholecystectomy.    No past medical history on file.  There are no active problems to display for this patient.   Past Surgical History:  Procedure Laterality Date  . BACK SURGERY      OB History    No data available       Home Medications    Prior to Admission medications   Medication Sig Start Date End Date Taking?  Authorizing Provider  doxycycline (VIBRAMYCIN) 100 MG capsule Take 100 mg by mouth every 12 (twelve) hours.   Yes [provider]  ferrous sulfate 325 (65 FE) MG tablet Take 325 mg by mouth 2 (two) times daily with a meal.   Yes [provider]  metroNIDAZOLE (FLAGYL) 500 MG tablet Take 500 mg by mouth 2 (two) times daily.   Yes [provider]  naproxen (EC NAPROSYN) 500 MG EC tablet Take 1 tablet (500 mg total) by mouth 2 (two) times daily with a meal. 02/15/15  Yes Menshew, Charlesetta Ivory, PA-C  ondansetron (ZOFRAN-ODT) 4 MG disintegrating tablet Take 4 mg by mouth every 8 (eight) hours as needed for nausea or vomiting.   Yes [provider]  oxyCODONE (OXY IR/ROXICODONE) 5 MG immediate release tablet Take 5 mg by mouth every 8 (eight) hours as needed for severe pain.   Yes [provider]  cephALEXin (KEFLEX) 500 MG capsule Take 1 capsule (500 mg total) by mouth 2 (two) times daily. 06/30/16 07/07/16  Barrett Henle, PA-C  cyclobenzaprine (FLEXERIL) 5 MG tablet Take 1 tablet (5 mg total) by mouth 3 (three) times daily as needed for muscle spasms. Patient not taking: Reported on 06/30/2016 02/15/15   Menshew, Charlesetta Ivory, PA-C  traMADol (ULTRAM) 50 MG tablet Take 1 tablet (50 mg total) by mouth every 6 (six) hours as needed. Patient not taking: Reported on 06/30/2016 12/26/14   Zenda Alpers,  Melchor AmourAllison P, MD    Family History No family history on file.  Social History Social History  Substance Use Topics  . Smoking status: Never Smoker  . Smokeless tobacco: Not on file  . Alcohol use No     Allergies   Patient has no known allergies.   Review of Systems Review of Systems  Gastrointestinal: Positive for abdominal pain, nausea and vomiting.  Genitourinary: Positive for hematuria.  All other systems reviewed and are negative.    Physical Exam Updated Vital Signs BP (!) 178/104   Pulse 78   Temp 97.6 F (36.4 C) (Oral)   Resp 18    Ht 5\' 4"  (1.626 m)   Wt 111.1 kg   LMP 05/29/2016 (Exact Date)   SpO2 100%   BMI 42.05 kg/m   Physical Exam  Constitutional: She is oriented to person, place, and time. She appears well-developed and well-nourished. No distress.  HENT:  Head: Normocephalic and atraumatic.  Mouth/Throat: Oropharynx is clear and moist. No oropharyngeal exudate.  Eyes: Conjunctivae and EOM are normal. Right eye exhibits no discharge. Left eye exhibits no discharge. No scleral icterus.  Neck: Normal range of motion. Neck supple.  Cardiovascular: Normal rate, regular rhythm, normal heart sounds and intact distal pulses.   Pulmonary/Chest: Effort normal and breath sounds normal. No respiratory distress. She has no wheezes. She has no rales. She exhibits no tenderness.  Abdominal: Soft. Bowel sounds are normal. She exhibits no distension and no mass. There is tenderness. There is no rigidity, no rebound, no guarding and no CVA tenderness. No hernia.  TTP over RLQ  Musculoskeletal: Normal range of motion. She exhibits no edema.  Neurological: She is alert and oriented to person, place, and time.  Skin: Skin is warm and dry. She is not diaphoretic.  Nursing note and vitals reviewed.  Pelvic exam: normal external genitalia, vulva, vagina, cervix, uterus and adnexa, VULVA: normal appearing vulva with no masses, tenderness or lesions, VAGINA: normal appearing vagina with normal color, no lesions, vaginal discharge - small amount of bloody mucoid discharge, CERVIX: normal appearing cervix without discharge or lesions, IUD stings present at cervical os, UTERUS: uterus is normal size, shape, consistency and nontender, ADNEXA: left normal adnexa in size without tenderness, tenderness over right adnexa with palpable mass, exam chaperoned by female nurse.   ED Treatments / Results  Labs (all labs ordered are listed, but only abnormal results are displayed) Labs Reviewed  WET PREP, GENITAL - Abnormal; Notable for the  following:       Result Value   WBC, Wet Prep HPF POC FEW (*)    All other components within normal limits  URINALYSIS, ROUTINE W REFLEX MICROSCOPIC - Abnormal; Notable for the following:    Hgb urine dipstick LARGE (*)    Protein, ur 30 (*)    Leukocytes, UA SMALL (*)    Bacteria, UA FEW (*)    Squamous Epithelial / LPF 0-5 (*)    All other components within normal limits  CBC - Abnormal; Notable for the following:    Hemoglobin 9.0 (*)    HCT 29.6 (*)    MCV 76.1 (*)    MCH 23.1 (*)    RDW 22.3 (*)    Platelets 542 (*)    All other components within normal limits  BASIC METABOLIC PANEL - Abnormal; Notable for the following:    Potassium 3.1 (*)    BUN <5 (*)    Calcium 8.4 (*)    All other  components within normal limits  HEPATIC FUNCTION PANEL - Abnormal; Notable for the following:    Total Protein 8.4 (*)    Albumin 2.5 (*)    ALT 11 (*)    Bilirubin, Direct <0.1 (*)    All other components within normal limits  URINE CULTURE  LIPASE, BLOOD  RPR  HIV ANTIBODY (ROUTINE TESTING)  I-STAT BETA HCG BLOOD, ED ([facility], [facility], [facility] ONLY)  GC/CHLAMYDIA PROBE AMP ([place]) NOT AT [facility]    EKG  EKG Interpretation None       Radiology US Transvaginal Non-ob  Result Date: 06/30/2016 CLINICAL DATA:  Right-sided adnexal tenderness and swelling since April 30th. EXAM: TRANSABDOMINAL AND TRANSVAGINAL ULTRASOUND OF PELVIS TECHNIQUE: Both transabdominal and transvaginal ultrasound examinations of the pelvis were performed. Transabdominal technique was performed for global imaging of the pelvis including uterus, ovaries, adnexal regions, and pelvic cul-de-sac. It was necessary to proceed with endovaginal exam following the transabdominal exam to visualize the uterus, ovaries, and adnexa. COMPARISON:  12/26/2014 FINDINGS: Uterus Measurements: 10.6 x 5.6 x 6.4 cm. Uterine fibroids identified, including a right-sided fundal lesion which measures 3.6 x 3.9 x 3.6 cm. Endometrium Thickness:  Normal, 1.1 cm.  Intrauterine device visualized. Right ovary Measurements: 4.3 x 1.7 x 1.7 cm. Suspicion of a right ovarian corpus luteal cyst, as evidenced by hypervascularity on image 30. Left ovary Measurements: 4.7 x 3.7 x 4.8 cm. A 3.8 cm complex, avascular cystic lesion is identified on image 19. Other findings Trace free pelvic fluid is likely physiologic. IMPRESSION: 1. Probable right ovarian corpus luteal cyst. 2. Uterine fibroids. 3. Left ovarian lesion which is most consistent with a hemorrhagic cyst. Electronically Signed   By: Jeronimo Greaves M.D.   On: 06/30/2016 14:26   US Pelvis Complete  Result Date: 06/30/2016 CLINICAL DATA:  Right-sided adnexal tenderness and swelling since April 30th. EXAM: TRANSABDOMINAL AND TRANSVAGINAL ULTRASOUND OF PELVIS TECHNIQUE: Both transabdominal and transvaginal ultrasound examinations of the pelvis were performed. Transabdominal technique was performed for global imaging of the pelvis including uterus, ovaries, adnexal regions, and pelvic cul-de-sac. It was necessary to proceed with endovaginal exam following the transabdominal exam to visualize the uterus, ovaries, and adnexa. COMPARISON:  12/26/2014 FINDINGS: Uterus Measurements: 10.6 x 5.6 x 6.4 cm. Uterine fibroids identified, including a right-sided fundal lesion which measures 3.6 x 3.9 x 3.6 cm. Endometrium Thickness: Normal, 1.1 cm.  Intrauterine device visualized. Right ovary Measurements: 4.3 x 1.7 x 1.7 cm. Suspicion of a right ovarian corpus luteal cyst, as evidenced by hypervascularity on image 30. Left ovary Measurements: 4.7 x 3.7 x 4.8 cm. A 3.8 cm complex, avascular cystic lesion is identified on image 19. Other findings Trace free pelvic fluid is likely physiologic. IMPRESSION: 1. Probable right ovarian corpus luteal cyst. 2. Uterine fibroids. 3. Left ovarian lesion which is most consistent with a hemorrhagic cyst. Electronically Signed   By: Jeronimo Greaves M.D.   On: 06/30/2016 14:26     Procedures Procedures (including critical care time)  Medications Ordered in ED Medications  potassium chloride SA (K-DUR,KLOR-CON) CR tablet 40 mEq (not administered)  ondansetron (ZOFRAN-ODT) disintegrating tablet 4 mg (4 mg Oral Given 06/30/16 1046)  sodium chloride 0.9 % bolus 1,000 mL (0 mLs Intravenous Stopped 06/30/16 1229)  morphine 4 MG/ML injection 4 mg (4 mg Intravenous Given 06/30/16 1105)     Initial Impression / Assessment and Plan / ED Course  I have reviewed the triage vital signs and the nursing notes.  Pertinent labs & imaging  results that were available during my care of the patient were reviewed by me and considered in my medical decision making (see chart for details).     Patient presents with right lower quadrant abdominal pain for the past week with associated nausea, vomiting and hematuria. Patient and she was recently discharged from the hospital on 5/2 for a right TOA but notes she has had continued pain which has gradually worsened over the weekend. Denies fever. She states she has been taking her antibiotics as prescribed but states she did not take them over the past 2 days due to her nausea and vomiting. VSS. Exam revealed tenderness over right lower quadrant, no peritoneal signs. No CVA tenderness. Pelvic exam revealed small amount of bloody mucoid discharge in vaginal vault, IUD strings present at cervical os, tenderness and swelling over right adnexa, no CMT. Patient given IV fluids, pain meds and antiemetics.  Chart review shows patient was recently admitted to [facility] on 06/22/16 - 06/24/16 for right TOA. Patient was treated with IV cefoxitin and doxycycline and discharged home with 2 week course of Doxy and Flagyl. She was noted to have an AKI during admission which was treated with IV fluids. Patient also has a history of abnormal uterine bleeding requiring transfusion on 06/17/16.   Pregnancy negative. UA showed evidence of infection with hematuria. Hgb  9, which appears improved from 7-8 during recent admissions at [facility]. K 3.1, pt given potassium supplement in the ED. Wet prep showed few WBCs, CG/chlamydia labs pending. Will order pelvic US for further evaluation of right adnexal pain and swelling on exam due to pt with hx of recent right TOA. Do not suspect appendicitis or other acute intra-abdominal etiology as pt's pain has remained constant in nature since recent admission for TOA, afebrile in ED without leukocytosis.   US showed right ovarian corpus luteal cyst, uterine fibroids and left ovarian hemorrhagic cyst. On reevaluation pt sitting resting comfortably in bed and reports improvement of sxs. Pt tolerating PO. Plan to d/c pt home with keflex for UTI, advised to continue taking doxy/flagly for TOA and follow up with GYN for follow up evaluation regarding TOA, ovarian cysts and IUD. Advised pt to follow up with PCP in 2-3 days for follow up regarding her blood work and BP management. Discussed return precautions.   Final Clinical Impressions(s) / ED Diagnoses   Final diagnoses:  Right adnexal tenderness  Acute cystitis with hematuria    New Prescriptions New Prescriptions   CEPHALEXIN (KEFLEX) 500 MG CAPSULE    Take 1 capsule (500 mg total) by mouth 2 (two) times daily.      Barrett Henle, PA-C 06/30/16 1456    Alvira Monday, MD 07/02/16 1743

## 2016-07-01 LAB — URINE CULTURE: Culture: <10000 — AB

## 2016-07-01 LAB — GC/CHLAMYDIA PROBE AMP (~~LOC~~) NOT AT ARMC
Chlamydia: NEGATIVE
Neisseria Gonorrhea: NEGATIVE

## 2016-07-01 LAB — HIV ANTIBODY (ROUTINE TESTING W REFLEX): HIV SCREEN 4TH GENERATION: NONREACTIVE

## 2016-07-01 LAB — RPR: RPR Ser Ql: NONREACTIVE

## 2017-01-09 IMAGING — US US TRANSVAGINAL NON-OB
1 series · 13 of 25 positions shown · non-contrast
Comparison: None

CLINICAL DATA: Heavy bleeding with periods for 3 years.

EXAM:
TRANSABDOMINAL AND TRANSVAGINAL ULTRASOUND OF PELVIS
TECHNIQUE: Both transabdominal and transvaginal ultrasound examinations of the
pelvis were performed. Transabdominal technique was performed for
global imaging of the pelvis including uterus, ovaries, adnexal
regions, and pelvic cul-de-sac. It was necessary to proceed with
endovaginal exam following the transabdominal exam to visualize the
uterus and ovaries.

[Series 1: us transvaginal non-ob · 0.21mm/px · 13 of 67 slices shown]
[im 1/67]
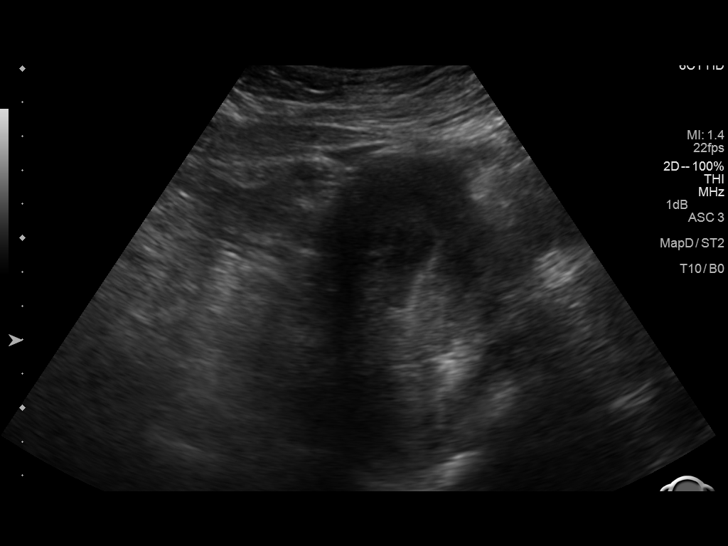
[im 6/67]
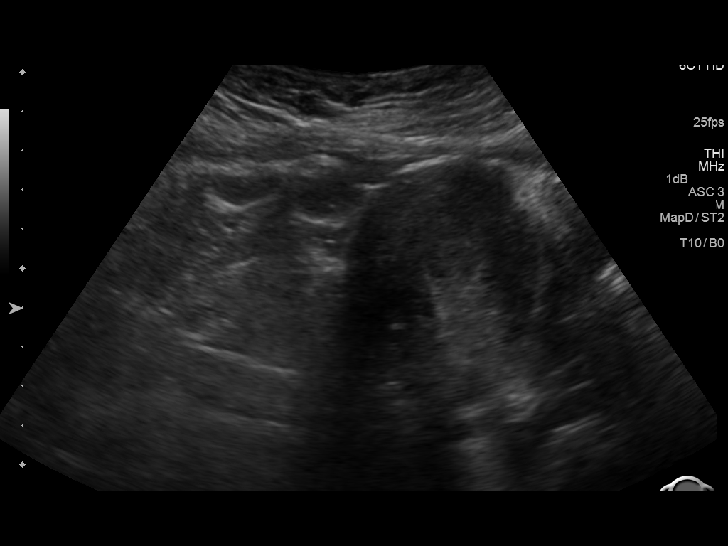
[im 12/67]
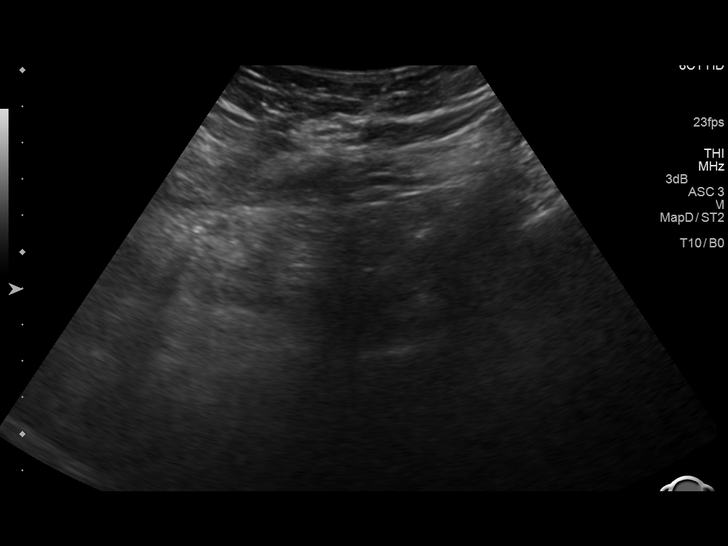
[im 17/67]
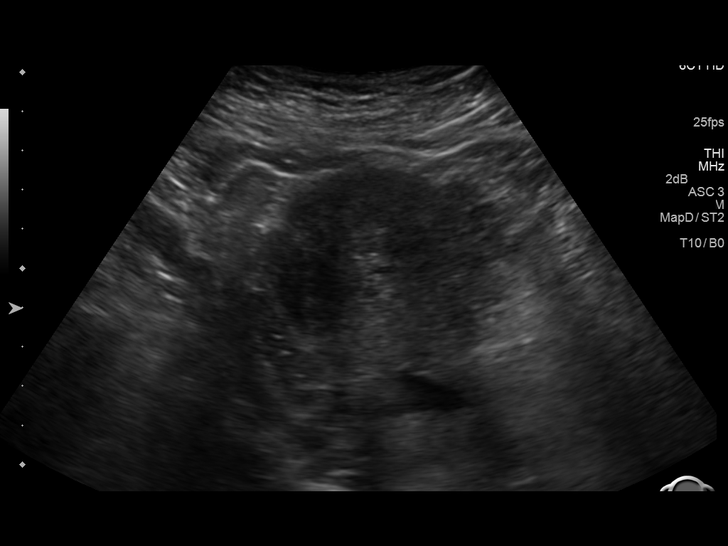
[im 23/67]
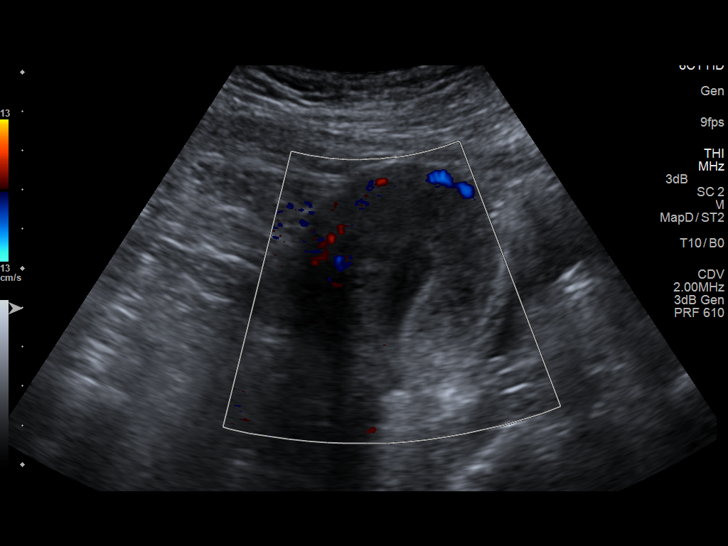
[im 28/67]
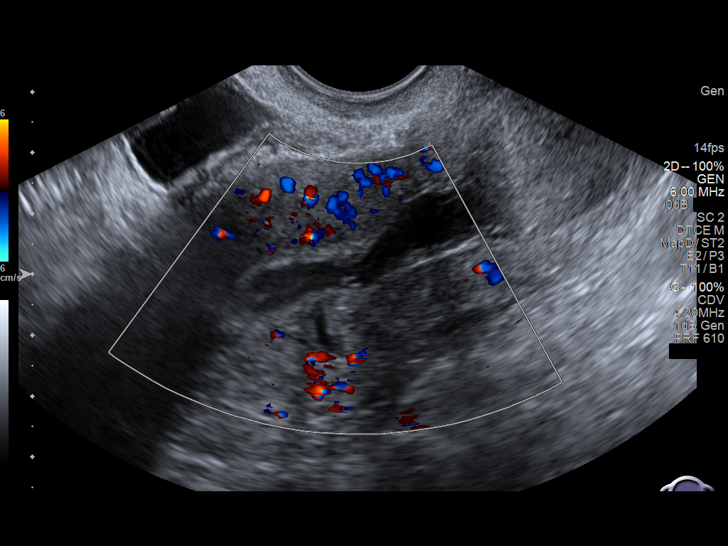
[im 34/67]
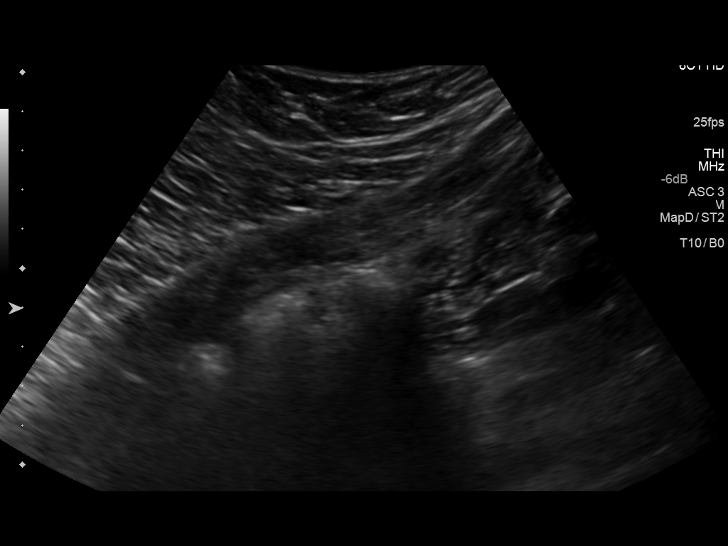
[im 39/67]
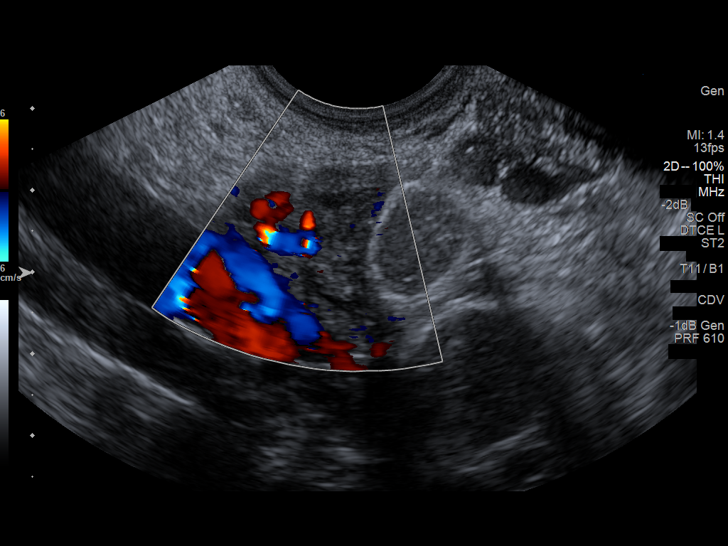
[im 45/67]
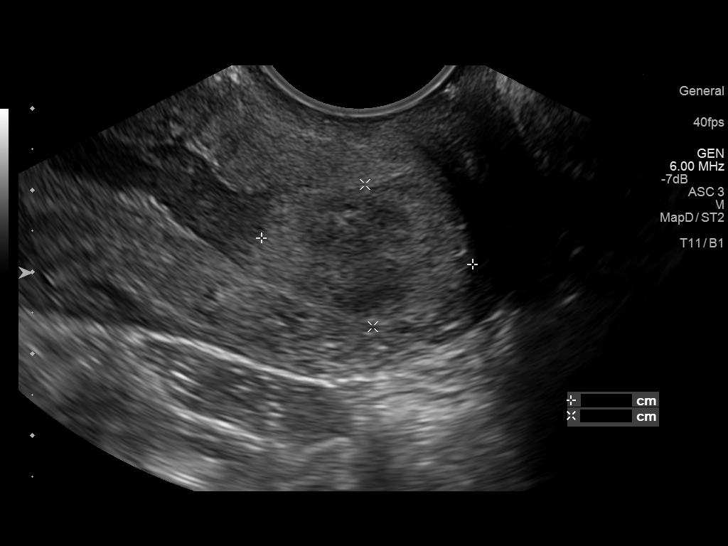
[im 50/67]
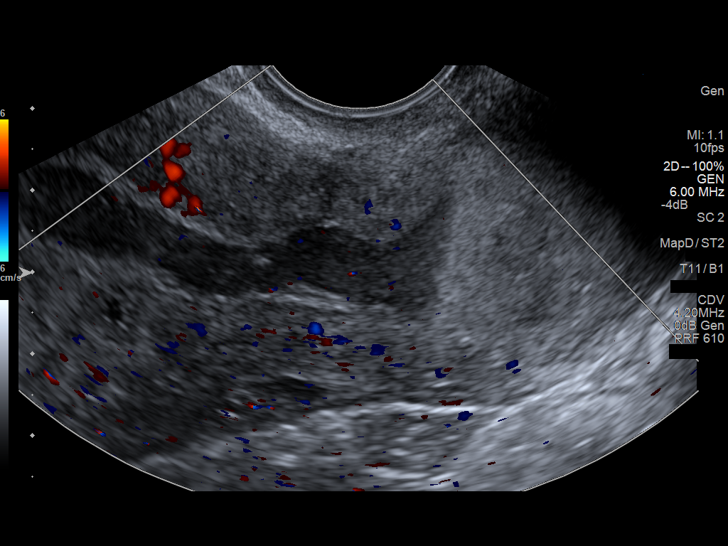
[im 56/67]
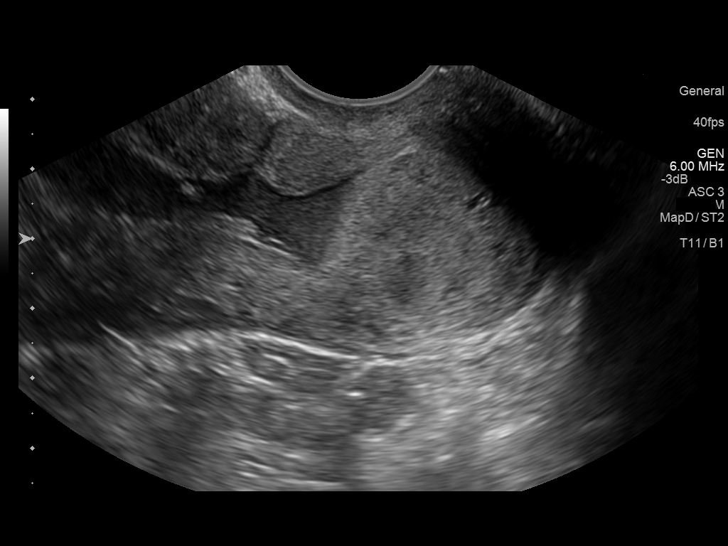
[im 61/67]
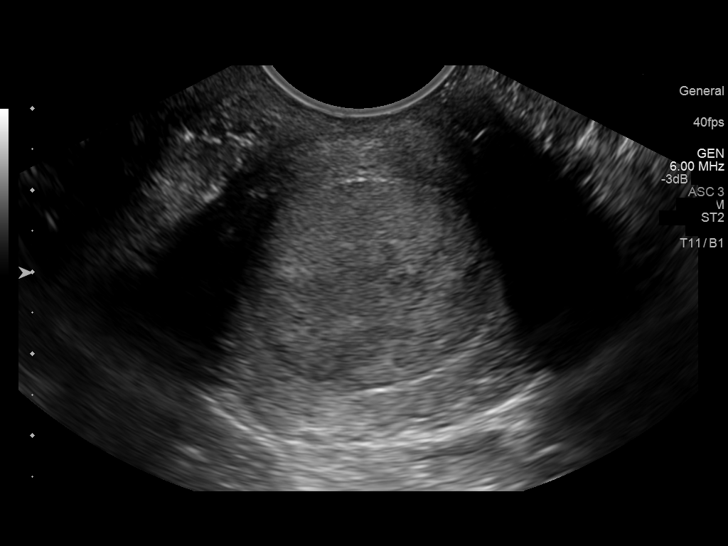
[im 67/67]
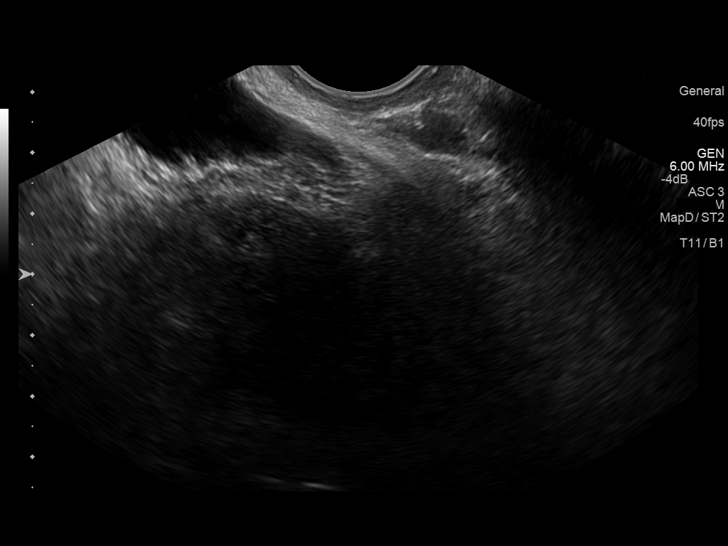

[13 of 25 positions shown; findings below may reference images not displayed]

FINDINGS: Uterus

Measurements: 8.2 x 4.7 x 5.4 cm. Uterus is anteverted.. No fibroids
or other mass visualized. Heterogeneous prominence of the cervix
without flow demonstrated on color flow Doppler imaging. This area
measures 2.6 x 1.7 x 2.9 cm. Cervical lesion not excluded.

Endometrium

Thickness: 14 mm. Endometrium is thickened and filled with
heterogeneous fluid. No flow is demonstrated in the endometrium on
color flow Doppler imaging. Patient is currently having period now
and this is likely menstrual flow.

Right ovary

Measurements: 2.3 x 1.8 x 2.1 cm. Normal appearance/no adnexal mass.

Left ovary

Left ovary is not visualized.

Other findings

No free fluid.
IMPRESSION: Thickened endometrial stripe filled with fluid and debris. No color
flow demonstrated. This is likely physiologic. Prominence of the
cervix is indeterminate and mass lesion not excluded. Suggest
follow-up imaging after completion of menses for further evaluation.
Suggest direct visualization of the cervix.

## 2017-09-11 ENCOUNTER — Encounter (HOSPITAL_COMMUNITY): Payer: Self-pay | Admitting: Emergency Medicine

## 2017-09-11 ENCOUNTER — Emergency Department (HOSPITAL_COMMUNITY): Payer: Medicaid Other

## 2017-09-11 ENCOUNTER — Emergency Department (HOSPITAL_COMMUNITY)
Admission: EM | Admit: 2017-09-11 | Discharge: 2017-09-11 | Disposition: A | Discharge status: Home / Self Care | Payer: Medicaid Other | Attending: Emergency Medicine | Admitting: Emergency Medicine

## 2017-09-11 DIAGNOSIS — I10 Essential (primary) hypertension: Secondary | ICD-10-CM | POA: Insufficient documentation

## 2017-09-11 DIAGNOSIS — R51 Headache: Secondary | ICD-10-CM | POA: Insufficient documentation

## 2017-09-11 DIAGNOSIS — R079 Chest pain, unspecified: Secondary | ICD-10-CM

## 2017-09-11 DIAGNOSIS — Z79899 Other long term (current) drug therapy: Secondary | ICD-10-CM | POA: Diagnosis not present

## 2017-09-11 DIAGNOSIS — R519 Headache, unspecified: Secondary | ICD-10-CM

## 2017-09-11 DIAGNOSIS — R0789 Other chest pain: Secondary | ICD-10-CM | POA: Diagnosis not present

## 2017-09-11 HISTORY — DX: Essential (primary) hypertension: I10

## 2017-09-11 LAB — CBC
HCT: 39.8 % (ref 36.0–46.0)
HEMOGLOBIN: 12.6 g/dL (ref 12.0–15.0)
MCH: 28.8 pg (ref 26.0–34.0)
MCHC: 31.7 g/dL (ref 30.0–36.0)
MCV: 91.1 fL (ref 78.0–100.0)
Platelets: 342 10*3/uL (ref 150–400)
RBC: 4.37 MIL/uL (ref 3.87–5.11)
RDW: 11.9 % (ref 11.5–15.5)
WBC: 5.5 10*3/uL (ref 4.0–10.5)

## 2017-09-11 LAB — I-STAT BETA HCG BLOOD, ED (MC, WL, AP ONLY): I-stat hCG, quantitative: <5 m[IU]/mL (ref <5)

## 2017-09-11 LAB — BASIC METABOLIC PANEL
ANION GAP: 8 (ref 5–15)
BUN: 13 mg/dL (ref 6–20)
CALCIUM: 8.4 mg/dL — AB (ref 8.9–10.3)
CO2: 24 mmol/L (ref 22–32)
CREATININE: 0.84 mg/dL (ref 0.44–1.00)
Chloride: 105 mmol/L (ref 98–111)
GFR calc Af Amer: >60 mL/min (ref >60)
GFR calc non Af Amer: >60 mL/min (ref >60)
GLUCOSE: 116 mg/dL — AB (ref 70–99)
Potassium: 3.6 mmol/L (ref 3.5–5.1)
Sodium: 137 mmol/L (ref 135–145)

## 2017-09-11 LAB — I-STAT TROPONIN, ED: TROPONIN I, POC: 0 ng/mL (ref 0.00–0.08)

## 2017-09-11 MED ORDER — SODIUM CHLORIDE 0.9 % IV BOLUS
1000.0000 mL | Freq: Once | INTRAVENOUS | Status: AC
  Administered 2017-09-11: 1000 mL via INTRAVENOUS

## 2017-09-11 MED ORDER — METOCLOPRAMIDE HCL 5 MG/ML IJ SOLN
10.0000 mg | Freq: Once | INTRAMUSCULAR | Status: AC
  Administered 2017-09-11: 10 mg via INTRAVENOUS
  Filled 2017-09-11: qty 2

## 2017-09-11 MED ORDER — DIPHENHYDRAMINE HCL 50 MG/ML IJ SOLN
25.0000 mg | Freq: Once | INTRAMUSCULAR | Status: AC
  Administered 2017-09-11: 25 mg via INTRAVENOUS
  Filled 2017-09-11: qty 1

## 2017-09-11 MED ORDER — METOCLOPRAMIDE HCL 10 MG PO TABS: 10.0000 mg | ORAL_TABLET | Freq: Four times a day (QID) | ORAL | 0 refills | Status: AC

## 2017-09-11 MED ORDER — ACETAMINOPHEN 500 MG PO TABS
1000.0000 mg | ORAL_TABLET | Freq: Once | ORAL | Status: AC
  Administered 2017-09-11: 1000 mg via ORAL
  Filled 2017-09-11: qty 2

## 2017-09-11 NOTE — ED Triage Notes (Signed)
Patient complains of 10/10 headache since last night and 8/10 central chest throbbing that started at 1130 today. Patient states she has not taken her antihypertensive medication for 3 our 4 days because she keeps forgetting to take it. Denies barriers to obtaining medication. Patient alert, oriented, and in no apparent distress at this time.

## 2017-09-11 NOTE — Discharge Instructions (Signed)
Please see the information and instructions below regarding your visit.  Your diagnoses today include:  1. Frontal headache   2. Right-sided chest pain     You were seen and treated in the emergency department today for headache. Fortunately, your vitals, exam, and work-up is reassuring with no apparent emergent cause for your headache at this time.  Tests performed today include: See side panel of your discharge paperwork for testing performed today. Vital signs are listed at the bottom of these instructions.   Your heart tests are also very reassuring today.  Medications prescribed:    Try to avoid daily or regular use of tylenol, aspirin, ibuprofen, and other overt-the-counter pain medications as this can contribute to rebound headaches.   You are prescribed Reglan, antinausea medication.  Please take this with 25 mg of Benadryl.  It will counteract any side effects such as jitteriness caused by Reglan.  Take any prescribed medications only as prescribed, and any over the counter medications only as directed on the packaging.  Home care instructions:   Drink plenty of fluids at home. This will help with your headache. Be cautious with caffeine use, as this can cause your headache to rebound when the effects wear off. If you drink more than 2 cups of coffee/caffeinated tea, or caffeinated soda per day, I suggest you wean down that amount.  Please follow any educational materials contained in this packet.   Follow-up instructions: Please follow-up with your primary care provider in one week for further evaluation of your symptoms if they are not completely improved.  Please discuss establishing care with neurology for your headaches when you get back to your primary care provider in [place].  Return instructions:  Please return to the Emergency Department if you experience worsening symptoms. It is VERY important that you monitor your symptoms at home. If you develop worsening  headache, new fever, new neck stiffness, rash, focal weakness or numbness, or any other new or concerning symptoms, please return to the ED immediately, as these may be signs that your headache has become a potentially serious and life-threatening condition.  Please return to the emergency department for any worsening chest pain, shortness of breath, dizziness or lightheadedness refill you are going to pass out. Please return if you have any other emergent concerns.  Additional Information:   Your vital signs today were: BP 128/72    Pulse (!) 52    Temp 98.5 F (36.9 C) (Oral)    Resp 11    SpO2 100%  If your blood pressure (BP) was elevated on multiple readings during this visit above 130 for the top number or above 80 for the bottom number, please have this repeated by your primary care provider within one month. --------------  Thank you for allowing us to participate in your care today.

## 2017-09-11 NOTE — ED Provider Notes (Signed)
[facility] EMERGENCY DEPARTMENT Provider Note   CSN: [id number] Arrival date & time: 09/11/17  1332     History   Chief Complaint Chief Complaint  Patient presents with  . Chest Pain    HPI Vanessa Banks is a 42 y.o. female.  HPI  Patient is a 42 year old female with a history of hypertension, on 2 antihypertensives, presenting for frontal headache and right-sided chest pain.  Patient reports that her symptoms began with a frontal headache yesterday while she was at work.  Patient does have the headache is beginning gradually and slowly worsening in intensity.  Patient denies thunderclap sensation.  Patient denies any visual disturbance, changes in level of consciousness, changes in speech, or weakness or numbness.  Patient denies any fevers, chills, neck stiffness, rashes.  Patient reports that she is nauseous with 2 episodes of vomiting today due to the pain.  Patient reports that she has had headaches recurrently in the past, but this 1 feels more severe in nature.  Patient has previously been prescribed abortive sumatriptan.  Patient reports that her chest pain is right-sided and began yesterday.  Patient reports that is primarily reproducible on palpation, and it does not hurt if she is completely at rest.  No exertional chest pain.  No shortness of breath or pleuritic nature to it.  Patient denies any coughing, hemoptysis, recent immobilization, hospitalization, estrogen use, cancer treatment history, history of DVT/PE or recent surgical history.  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  Past Medical History:  Diagnosis Date  . Hypertension     There are no active problems to display for this patient.   Past Surgical History:  Procedure Laterality Date  . BACK SURGERY       OB History   None      Home Medications    Prior to Admission  medications   Medication Sig Start Date End Date Taking? Authorizing Provider  cyclobenzaprine (FLEXERIL) 5 MG tablet Take 1 tablet (5 mg total) by mouth 3 (three) times daily as needed for muscle spasms. Patient not taking: Reported on 06/30/2016 02/15/15   Menshew, Charlesetta Ivory, PA-C  doxycycline (VIBRAMYCIN) 100 MG capsule Take 100 mg by mouth every 12 (twelve) hours.    [provider]  ferrous sulfate 325 (65 FE) MG tablet Take 325 mg by mouth 2 (two) times daily with a meal.    [provider]  metroNIDAZOLE (FLAGYL) 500 MG tablet Take 500 mg by mouth 2 (two) times daily.    [provider]  naproxen (EC NAPROSYN) 500 MG EC tablet Take 1 tablet (500 mg total) by mouth 2 (two) times daily with a meal. 02/15/15   Menshew, Charlesetta Ivory, PA-C  ondansetron (ZOFRAN-ODT) 4 MG disintegrating tablet Take 4 mg by mouth every 8 (eight) hours as needed for nausea or vomiting.    [provider]  oxyCODONE (OXY IR/ROXICODONE) 5 MG immediate release tablet Take 5 mg by mouth every 8 (eight) hours as needed for severe pain.    [provider]  traMADol (ULTRAM) 50 MG tablet Take 1 tablet (50 mg total) by mouth every 6 (six) hours as needed. Patient not taking: Reported on 06/30/2016 12/26/14   Rebecka Apley, MD    Family History No family history on file.  Social History Social History   Tobacco Use  . Smoking  status: Never Smoker  Substance Use Topics  . Alcohol use: No  . Drug use: Not on file     Allergies   Patient has no known allergies.   Review of Systems Review of Systems  Constitutional: Negative for chills and fever.  HENT: Negative for congestion, rhinorrhea, sinus pain and sore throat.   Eyes: Negative for visual disturbance.  Respiratory: Negative for cough, chest tightness and shortness of breath.   Cardiovascular: Negative for chest pain, palpitations and leg swelling.  Gastrointestinal: Positive for nausea and vomiting.  Negative for abdominal pain.  Genitourinary: Negative for dysuria and flank pain.  Musculoskeletal: Negative for back pain and myalgias.  Skin: Negative for rash.  Neurological: Positive for headaches. Negative for dizziness, syncope, weakness, light-headedness and numbness.  All other systems reviewed and are negative.    Physical Exam Updated Vital Signs BP 128/72   Pulse (!) 58   Temp 98.5 F (36.9 C) (Oral)   Resp 12   SpO2 98%   Physical Exam  Constitutional: She appears well-developed and well-nourished. No distress.  HENT:  Head: Normocephalic and atraumatic.  Mouth/Throat: Oropharynx is clear and moist.  Eyes: Pupils are equal, round, and reactive to light. Conjunctivae and EOM are normal.  Neck: Normal range of motion. Neck supple.  No nuchal rigidity.  No meningismus.  Negative Brudzinski sign.  Cardiovascular: Normal rate, regular rhythm, S1 normal and S2 normal.  No murmur heard. Pulses:      Radial pulses are 2+ on the right side, and 2+ on the left side.       Dorsalis pedis pulses are 2+ on the right side, and 2+ on the left side.  Pulmonary/Chest: Effort normal and breath sounds normal. She has no wheezes. She has no rales.  Abdominal: Soft. She exhibits no distension. There is no tenderness. There is no guarding.  Musculoskeletal: Normal range of motion. She exhibits no edema or deformity.  No calf TTP.  Lymphadenopathy:    She has no cervical adenopathy.  Neurological: She is alert.  Mental Status:  Alert, oriented, thought content appropriate, able to give a coherent history. Speech fluent without evidence of aphasia. Able to follow 2 step commands without difficulty.  Cranial Nerves:  II:  Peripheral visual fields grossly normal, pupils equal, round, reactive to light. Fundi normal. III,IV, VI: ptosis not present, extra-ocular motions intact bilaterally  V,VII: smile symmetric, facial light touch sensation equal VIII: hearing grossly normal to voice    X: uvula elevates symmetrically  XI: bilateral shoulder shrug symmetric and strong XII: midline tongue extension without fassiculations Motor:  Normal tone. 5/5 in upper and lower extremities bilaterally including strong and equal grip strength and dorsiflexion/plantar flexion Sensory: Pinprick and light touch normal in all extremities.  Deep Tendon Reflexes: 2+ and symmetric in the biceps and patella.  Cerebellar: normal finger-to-nose with bilateral upper extremities Gait: normal gait and balance Stance:  No pronator drift and good coordination, strength, and position sense with tapping of bilateral arms (performed in sitting position). CV: distal pulses palpable throughout   Skin: Skin is warm and dry. No rash noted. No erythema.  Psychiatric: She has a normal mood and affect. Her behavior is normal. Judgment and thought content normal.  Nursing note and vitals reviewed.    ED Treatments / Results  Labs (all labs ordered are listed, but only abnormal results are displayed) Labs Reviewed  BASIC METABOLIC PANEL - Abnormal; Notable for the following components:      Result Value  Glucose, Bld 116 (*)    Calcium 8.4 (*)    All other components within normal limits  CBC  I-STAT TROPONIN, ED  I-STAT BETA HCG BLOOD, ED ([facility], [facility], [facility] ONLY)    EKG EKG Interpretation  Date/Time:  Saturday September 11 2017 13:40:21 EDT Ventricular Rate:  99 PR Interval:  146 QRS Duration: 76 QT Interval:  334 QTC Calculation: 428 R Axis:   76 Text Interpretation:  Normal sinus rhythm Normal ECG No old tracing to compare Confirmed by Marily Memos [phone]) on 09/11/2017 1:42:30 PM   Radiology Dg Chest 2 View  Result Date: 09/11/2017 CLINICAL DATA:  Chest pain, short of breath EXAM: CHEST - 2 VIEW COMPARISON:  None. FINDINGS: Normal mediastinum and cardiac silhouette. Normal pulmonary vasculature. No evidence of effusion, infiltrate, or pneumothorax. No acute bony abnormality. 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 IMPRESSION: No acute cardiopulmonary process. Electronically Signed   By: Genevive Bi M.D.   On: 09/11/2017 14:25    Procedures Procedures (including critical care time)  Medications Ordered in ED Medications  sodium chloride 0.9 % bolus 1,000 mL (1,000 mLs Intravenous New Bag/Given 09/11/17 1509)  metoCLOPramide (REGLAN) injection 10 mg (10 mg Intravenous Given 09/11/17 1510)  diphenhydrAMINE (BENADRYL) injection 25 mg (25 mg Intravenous Given 09/11/17 1511)  acetaminophen (TYLENOL) tablet 1,000 mg (1,000 mg Oral Given 09/11/17 1509)     Initial Impression / Assessment and Plan / ED Course  I have reviewed the triage vital signs and the nursing notes.  Pertinent labs & imaging results that were available during my care of the patient were reviewed by me and considered in my medical decision making (see chart for details).    Chest pain is highly atypical for ACS or pulmonary embolism given that there is no pleuritic nature, chest pain is not occurring while at rest, and occurs only with movement, and patient has no shortness of breath.  Troponin negative. EKG in NSR w/o abnormality. Nature of pain, vital signs, and examination not consistent with AAS.  Additionally, patient has no wide mediastinum. Likely MSK in nature.  Patient is most concerned about headache today.  Patient without high-risk features of headache including: sudden onset/thunderclap HA, no similar headache in past, altered mental status, accompanying seizure, headache with exertion, age > 51, history of immunocompromise, neck or shoulder pain, fever, use of anticoagulation, family history of spontaneous SAH, concomitant drug use, toxic exposure.   Patient has a normal complete neurological exam, normal vital signs, normal level of consciousness, no signs of meningismus, is well-appearing/non-toxic appearing, no signs of trauma. No papilledema, no pain over the temporal arteries.   Imaging with  CT/MRI not indicated given history and physical exam findings.    No dangerous or life-threatening conditions suspected or identified by history, physical exam, and by work-up. No indications for hospitalization identified.  Patient treated symptomatically with Tylenol, fluids, Reglan and Benadryl.  Patient had complete resolution of her symptoms.  Patient was given return precautions for any worsening headache, visual disturbance, weakness or numbness, fever or chills.  Patient is understanding and agrees with the plan of care.  Final Clinical Impressions(s) / ED Diagnoses   Final diagnoses:  Frontal headache  Right-sided chest pain    ED Discharge Orders        Ordered    metoCLOPramide (REGLAN) 10 MG tablet  Every 6 hours     09/11/17 1711       Elisha Ponder, [place] 09/11/17 1816  Mesner, Barbara Cower, MD 09/14/17 [phone]

## 2018-11-18 ENCOUNTER — Other Ambulatory Visit: Payer: Self-pay

## 2018-11-18 DIAGNOSIS — Z20822 Contact with and (suspected) exposure to covid-19: Secondary | ICD-10-CM

## 2018-11-19 LAB — NOVEL CORONAVIRUS, NAA: SARS-CoV-2, NAA: NOT DETECTED

## 2018-11-22 ENCOUNTER — Other Ambulatory Visit: Payer: Self-pay

## 2018-11-22 DIAGNOSIS — Z20822 Contact with and (suspected) exposure to covid-19: Secondary | ICD-10-CM

## 2018-11-23 LAB — NOVEL CORONAVIRUS, NAA: SARS-CoV-2, NAA: NOT DETECTED

## 2018-11-29 ENCOUNTER — Other Ambulatory Visit: Payer: Self-pay

## 2018-11-29 DIAGNOSIS — Z20822 Contact with and (suspected) exposure to covid-19: Secondary | ICD-10-CM

## 2018-11-29 NOTE — Progress Notes (Signed)
[id number] 

## 2018-12-01 LAB — NOVEL CORONAVIRUS, NAA: SARS-CoV-2, NAA: NOT DETECTED

## 2018-12-05 ENCOUNTER — Other Ambulatory Visit: Payer: Self-pay

## 2018-12-05 DIAGNOSIS — Z20822 Contact with and (suspected) exposure to covid-19: Secondary | ICD-10-CM

## 2018-12-06 LAB — NOVEL CORONAVIRUS, NAA: SARS-CoV-2, NAA: NOT DETECTED

## 2018-12-14 ENCOUNTER — Other Ambulatory Visit: Payer: Self-pay

## 2018-12-14 DIAGNOSIS — Z20822 Contact with and (suspected) exposure to covid-19: Secondary | ICD-10-CM

## 2018-12-15 LAB — NOVEL CORONAVIRUS, NAA: SARS-CoV-2, NAA: NOT DETECTED

## 2018-12-20 ENCOUNTER — Other Ambulatory Visit: Payer: Self-pay

## 2018-12-20 DIAGNOSIS — Z20822 Contact with and (suspected) exposure to covid-19: Secondary | ICD-10-CM

## 2018-12-22 LAB — NOVEL CORONAVIRUS, NAA: SARS-CoV-2, NAA: NOT DETECTED

## 2018-12-27 ENCOUNTER — Other Ambulatory Visit: Payer: Self-pay

## 2018-12-27 DIAGNOSIS — Z20822 Contact with and (suspected) exposure to covid-19: Secondary | ICD-10-CM

## 2018-12-28 LAB — NOVEL CORONAVIRUS, NAA: SARS-CoV-2, NAA: NOT DETECTED

## 2019-01-02 ENCOUNTER — Other Ambulatory Visit: Payer: Self-pay

## 2019-01-02 DIAGNOSIS — Z20822 Contact with and (suspected) exposure to covid-19: Secondary | ICD-10-CM

## 2019-01-03 LAB — NOVEL CORONAVIRUS, NAA: SARS-CoV-2, NAA: NOT DETECTED

## 2019-05-06 IMAGING — CR DG CHEST 2V
2 series · 2 of 2 positions shown · non-contrast
Comparison: None.

CLINICAL DATA: Chest pain, short of breath

EXAM:
CHEST - 2 VIEW

[chest lat]
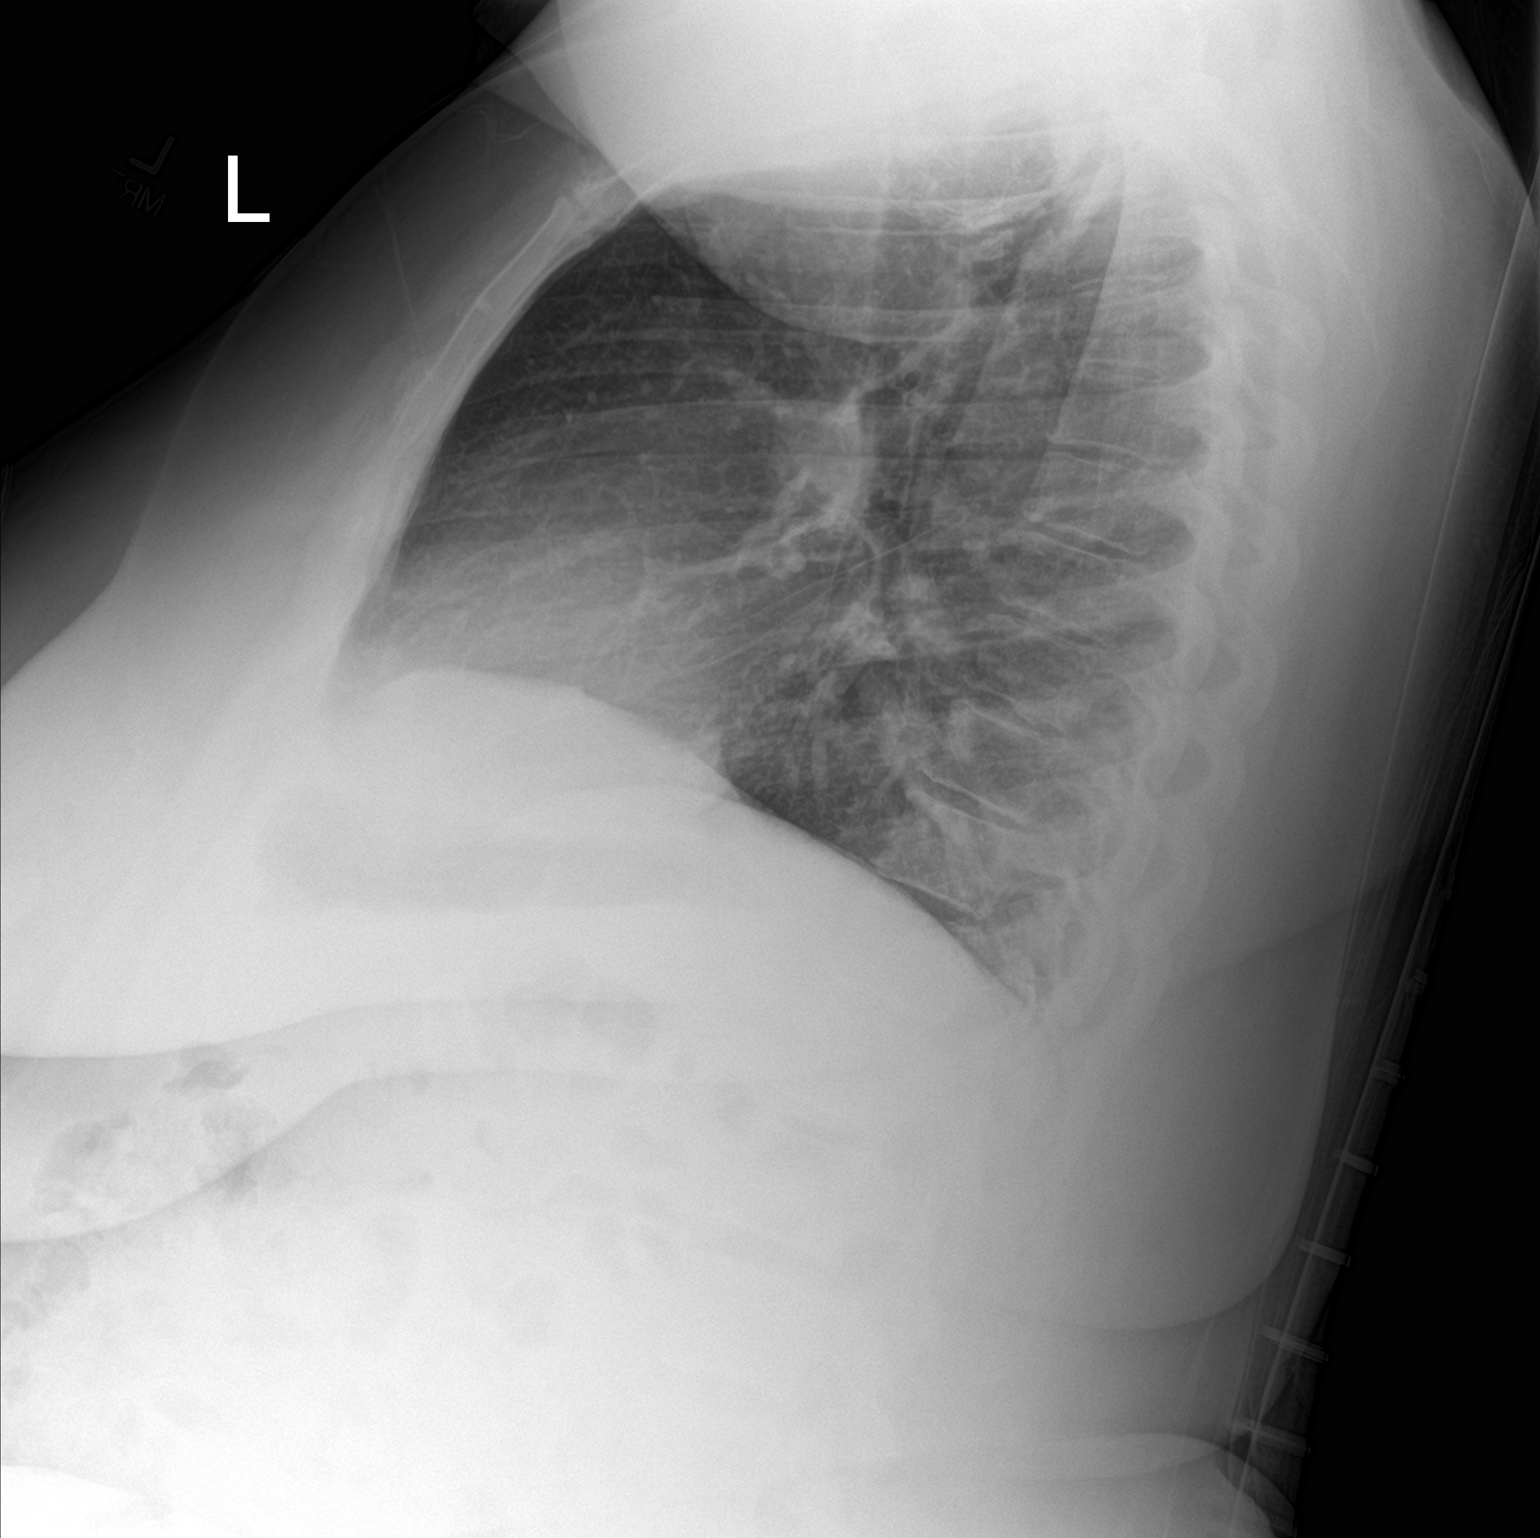

[chest ap]
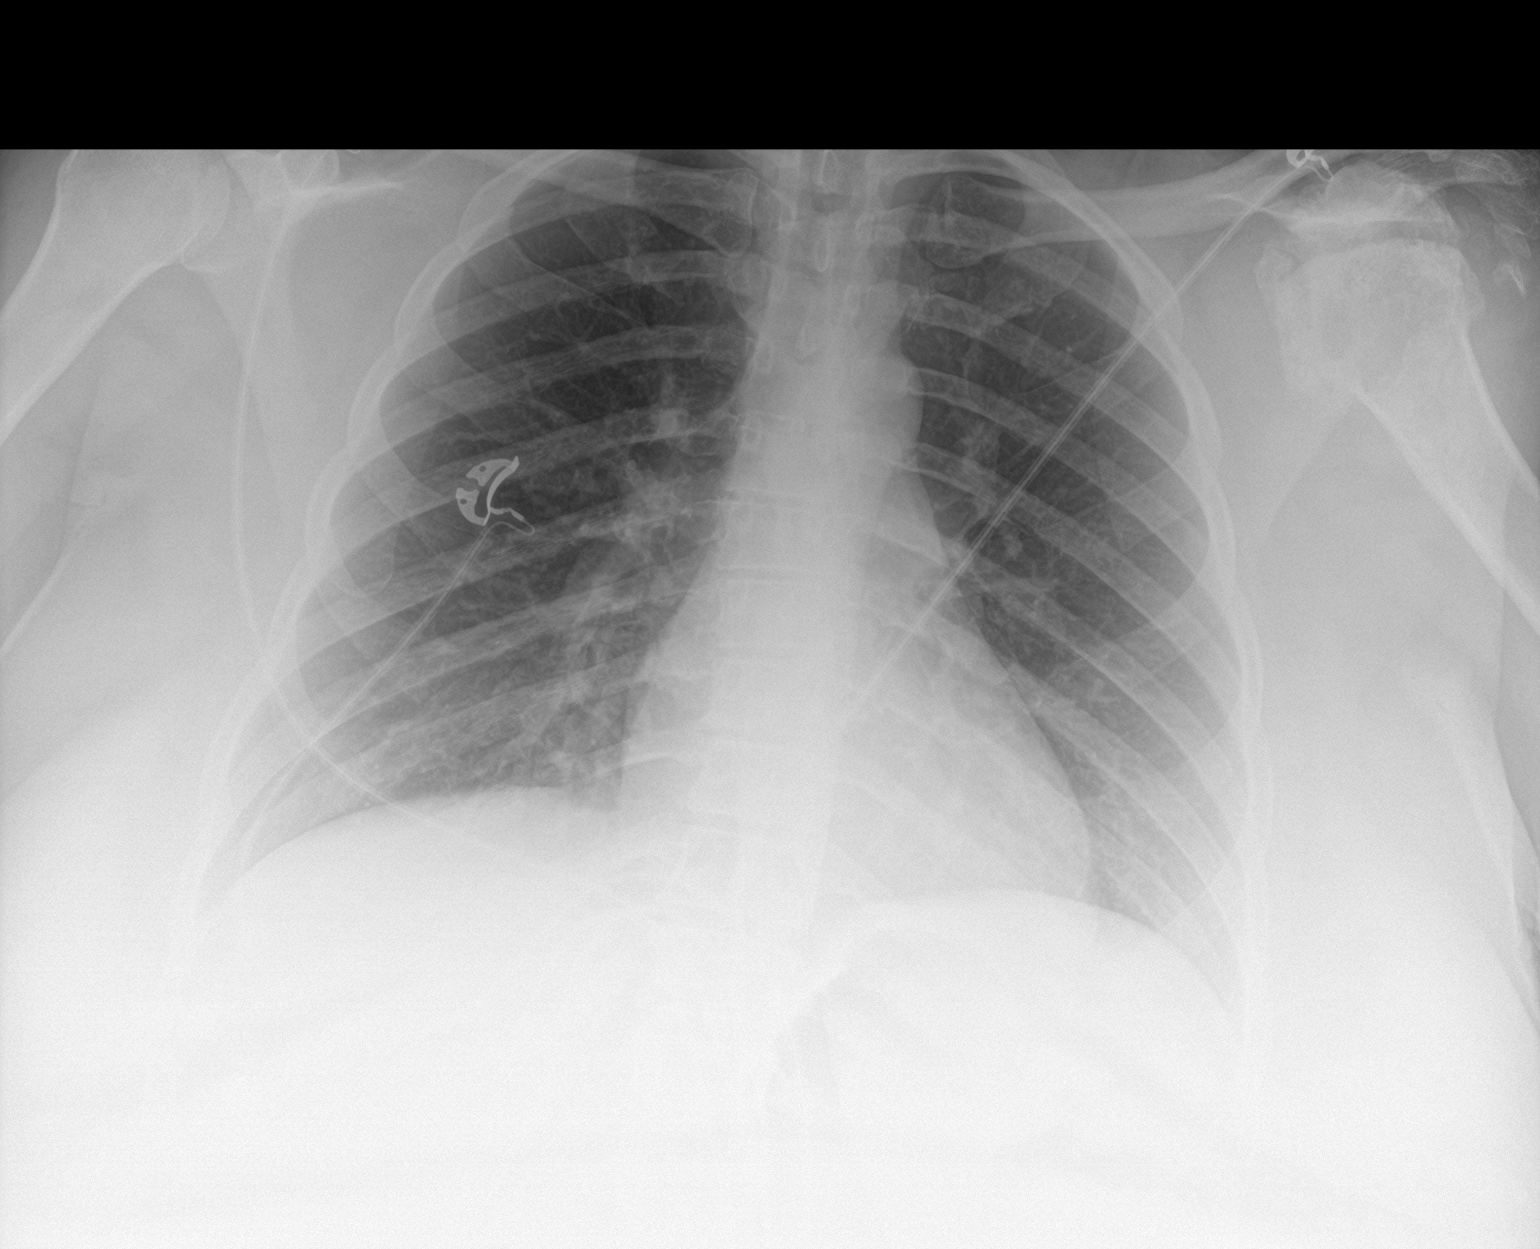

[2 of 2 positions shown; findings below may reference images not displayed]

FINDINGS: Normal mediastinum and cardiac silhouette. Normal pulmonary
vasculature. No evidence of effusion, infiltrate, or pneumothorax.
No acute bony abnormality. Severe degenerate change of the LEFT
shoulder.
IMPRESSION: No acute cardiopulmonary process.

## 2019-10-27 ENCOUNTER — Ambulatory Visit: Payer: Self-pay | Admitting: *Deleted

## 2019-10-27 NOTE — Telephone Encounter (Signed)
Summary: Call back request    Best contact: [phone]   Pt has questions regarding Covid 19, her son is positive. She and her daughter are experiencing symptoms even though they tested negative. Please advise      Patient had had home exposure to + child in home- she has developed symptoms- but tested negative for COVID- all symptoms are favorable for COVID infection- advised per COVID protocol- she will do UC/virtual visit if gets worse. She will contact pediatrician for daughter. She is going to follow exposure/ + COVID protocols.  Reason for Disposition . [1] COVID-19 infection suspected by caller or triager AND [2] mild symptoms (cough, fever, or others) AND [3] no complications or SOB  Answer Assessment - Initial Assessment Questions 1. COVID-19 CLOSE CONTACT: "Who is the person with the confirmed or suspected COVID-19 infection that you were exposed to?"     8/25 son tested + COVID  2. PLACE of CONTACT: "Where were you when you were exposed to COVID-19?" (e.g., home, school, medical waiting room; which city?)     home 3. TYPE of CONTACT: "How much contact was there?" (e.g., sitting next to, live in same house, work in same office, same building)     Same home 4. DURATION of CONTACT: "How long were you in contact with the COVID-19 patient?" (e.g., a few seconds, passed by person, a few minutes, 15 minutes or longer, live with the patient)     Live in home 5. MASK: "Were you wearing a mask?" "Was the other person wearing a mask?" Note: wearing a mask reduces the risk of an otherwise close contact.     No mask 6. DATE of CONTACT: "When did you have contact with a COVID-19 patient?" (e.g., how many days ago)     8/25 son tested + COVID- patient states she and her daughter tested - but are negative- they are having typical COVID symptoms. 7. COMMUNITY SPREAD: "Are there lots of cases of COVID-19 (community spread) where you live?" (See public health department website, if unsure)        yes 8. SYMPTOMS: "Do you have any symptoms?" (e.g., fever, cough, breathing difficulty, loss of taste or smell)     Low grade fever,loss taste/smell, fatigue 9. PREGNANCY OR POSTPARTUM: "Is there any chance you are pregnant?" "When was your last menstrual period?" "Did you deliver in the last 2 weeks?"     Not asked 10. HIGH RISK: "Do you have any heart or lung problems?" "Do you have a weak immune system?" (e.g., heart failure, COPD, asthma, HIV positive, chemotherapy, renal failure, diabetes mellitus, sickle cell anemia, obesity)       Patient has had vaccine series 11. TRAVEL: "Have you traveled out of the country recently?" If Yes, ask: "When and where?" Also ask about out-of-state travel, since the CDC has identified some high-risk cities for community spread in the [place]. Note: Travel becomes less relevant if there is widespread community transmission where the patient lives.       no  Protocols used: CORONAVIRUS (COVID-19) DIAGNOSED OR SUSPECTED-A-AH, CORONAVIRUS (COVID-19) EXPOSURE-A-AH
